# Patient Record
Sex: Female | Born: 1995 | Hispanic: No | Marital: Single | Smoking: Never smoker
Health system: Southern US, Community
[De-identification: ages and names within clinical notes are randomized; demographics above are authoritative.]

## PROBLEM LIST (undated history)

## (undated) DIAGNOSIS — R55 Syncope and collapse: Secondary | ICD-10-CM

---

## 2015-07-25 ENCOUNTER — Encounter (HOSPITAL_COMMUNITY): Payer: Self-pay | Admitting: Emergency Medicine

## 2015-07-25 ENCOUNTER — Emergency Department (HOSPITAL_COMMUNITY): Payer: Self-pay

## 2015-07-25 ENCOUNTER — Emergency Department (HOSPITAL_COMMUNITY)
Admission: EM | Admit: 2015-07-25 | Discharge: 2015-07-26 | Disposition: A | Discharge status: Home / Self Care | Payer: Self-pay | Attending: Emergency Medicine | Admitting: Emergency Medicine

## 2015-07-25 DIAGNOSIS — F419 Anxiety disorder, unspecified: Secondary | ICD-10-CM | POA: Insufficient documentation

## 2015-07-25 DIAGNOSIS — R0789 Other chest pain: Secondary | ICD-10-CM | POA: Insufficient documentation

## 2015-07-25 DIAGNOSIS — R51 Headache: Secondary | ICD-10-CM | POA: Insufficient documentation

## 2015-07-25 DIAGNOSIS — R519 Headache, unspecified: Secondary | ICD-10-CM

## 2015-07-25 DIAGNOSIS — R55 Syncope and collapse: Secondary | ICD-10-CM | POA: Insufficient documentation

## 2015-07-25 HISTORY — DX: Syncope and collapse: R55

## 2015-07-25 LAB — COMPREHENSIVE METABOLIC PANEL
ALBUMIN: 4.6 g/dL (ref 3.5–5.0)
ALT: 15 U/L (ref 14–54)
ANION GAP: 10 (ref 5–15)
AST: 25 U/L (ref 15–41)
Alkaline Phosphatase: 63 U/L (ref 38–126)
BUN: 12 mg/dL (ref 6–20)
CHLORIDE: 104 mmol/L (ref 101–111)
CO2: 23 mmol/L (ref 22–32)
Calcium: 9.9 mg/dL (ref 8.9–10.3)
Creatinine, Ser: 0.71 mg/dL (ref 0.44–1.00)
GFR calc Af Amer: >60 mL/min (ref >60)
Glucose, Bld: 91 mg/dL (ref 65–99)
POTASSIUM: 3.4 mmol/L — AB (ref 3.5–5.1)
Sodium: 137 mmol/L (ref 135–145)
Total Bilirubin: 0.7 mg/dL (ref 0.3–1.2)
Total Protein: 7.8 g/dL (ref 6.5–8.1)

## 2015-07-25 LAB — CBG MONITORING, ED: Glucose-Capillary: 93 mg/dL (ref 65–99)

## 2015-07-25 LAB — I-STAT TROPONIN, ED: TROPONIN I, POC: 0 ng/mL (ref 0.00–0.08)

## 2015-07-25 LAB — CBC
HEMATOCRIT: 36.7 % (ref 36.0–46.0)
HEMOGLOBIN: 12.2 g/dL (ref 12.0–15.0)
MCH: 25.5 pg — ABNORMAL LOW (ref 26.0–34.0)
MCHC: 33.2 g/dL (ref 30.0–36.0)
MCV: 76.6 fL — AB (ref 78.0–100.0)
Platelets: 252 10*3/uL (ref 150–400)
RBC: 4.79 MIL/uL (ref 3.87–5.11)
RDW: 13.1 % (ref 11.5–15.5)
WBC: 7.8 10*3/uL (ref 4.0–10.5)

## 2015-07-25 LAB — I-STAT BETA HCG BLOOD, ED (MC, WL, AP ONLY)

## 2015-07-25 LAB — I-STAT CG4 LACTIC ACID, ED: Lactic Acid, Venous: 1.25 mmol/L (ref 0.5–2.0)

## 2015-07-25 MED ORDER — SODIUM CHLORIDE 0.9 % IV BOLUS (SEPSIS)
1000.0000 mL | Freq: Once | INTRAVENOUS | Status: AC
  Administered 2015-07-25: 1000 mL via INTRAVENOUS

## 2015-07-25 MED ORDER — AMMONIA AROMATIC IN INHA
RESPIRATORY_TRACT | Status: AC
  Filled 2015-07-25: qty 10

## 2015-07-25 NOTE — ED Notes (Signed)
Bed: WA05 Expected date:  Expected time:  Means of arrival:  Comments: EMS 

## 2015-07-25 NOTE — ED Notes (Addendum)
Per EMS, pt. from home with complaint of chest pain with dizziness, pt. Was unresponsive upon EMS arrival, but once sat up by EMS pt. Started talking to EMS, no N/V . No SOB  . EMS reported of noticing pt. With twitching at times. Speak limited english.

## 2015-07-26 NOTE — ED Notes (Addendum)
Pt. Ambulated in hallway,steady gait but still claimed of light headedness and slight dizziness.no SOB.

## 2015-07-26 NOTE — ED Provider Notes (Signed)
CSN: [id number]     Arrival date & time 07/25/15  2059 History   First MD Initiated Contact with Patient 07/25/15 2104     Chief Complaint  Patient presents with  . Chest Pain  . Altered Mental Status     (Consider location/radiation/quality/duration/timing/severity/associated sxs/prior Treatment) HPI Patient presents to the emergency department with chest pain with dizziness and syncope in the patient was unresponsive when EMS arrived and the translator states that the patient has had no nausea, vomiting, shortness of breath, weakness,  blurred vision, back pain, neck pain, fever, cough, runny nose, sore throat, abdominal pain, or rash.  The patient recently came to the [place] from [place].  The patient has had episodes like this in the past.  Nothing seemed to make the patient's condition, better or worse.  She did complain of some mild headache earlier today Past Medical History  Diagnosis Date  . Syncope    History reviewed. No pertinent past surgical history. History reviewed. No pertinent family history. Social History  Substance Use Topics  . Smoking status: Never Smoker   . Smokeless tobacco: None  . Alcohol Use: No   OB History    No data available     Review of Systems  All other systems negative except as documented in the HPI. All pertinent positives and negatives as reviewed in the HPI.  Allergies  Review of patient's allergies indicates no known allergies.  Home Medications   Prior to Admission medications   Not on File   BP 111/58 mmHg  Pulse 63  Temp(Src) 98.3 F (36.8 C) (Oral)  Resp 16  SpO2 100%  LMP 07/09/2015 Physical Exam  Constitutional: She is oriented to person, place, and time. She appears well-developed and well-nourished. No distress.  HENT:  Head: Normocephalic and atraumatic.  Mouth/Throat: Oropharynx is clear and moist.  Eyes: Pupils are equal, round, and reactive to light.  Neck: Normal range of motion. Neck supple.   Cardiovascular: Normal rate, regular rhythm and normal heart sounds.  Exam reveals no gallop and no friction rub.   No murmur heard. Pulmonary/Chest: Effort normal and breath sounds normal. No respiratory distress.  Abdominal: Soft. Bowel sounds are normal. She exhibits no distension. There is no tenderness.  Musculoskeletal: She exhibits no edema.  Neurological: She is alert and oriented to person, place, and time. She exhibits normal muscle tone. Coordination normal.  Skin: Skin is warm and dry. No rash noted. No erythema.  Psychiatric: Her behavior is normal. Her mood appears anxious.  Nursing note and vitals reviewed.   ED Course  Procedures (including critical care time) Labs Review Labs Reviewed  COMPREHENSIVE METABOLIC PANEL - Abnormal; Notable for the following:    Potassium 3.4 (*)    All other components within normal limits  CBC - Abnormal; Notable for the following:    MCV 76.6 (*)    MCH 25.5 (*)    All other components within normal limits  CBG MONITORING, ED  I-STAT TROPOININ, ED  I-STAT BETA HCG BLOOD, ED ([facility], [facility], [facility] ONLY)  I-STAT CG4 LACTIC ACID, ED    Imaging Review Ct Head Wo Contrast  07/25/2015   CLINICAL DATA:  Chest pain, dizziness  EXAM: CT HEAD WITHOUT CONTRAST  TECHNIQUE: Contiguous axial images were obtained from the base of the skull through the vertex without intravenous contrast.  COMPARISON:  None.  FINDINGS: No skull fracture is noted. Paranasal sinuses and mastoids air cells are unremarkable. No intracranial hemorrhage, mass effect or midline  shift. No hydrocephalus. No mass lesion is noted on this unenhanced scan. The gray and white-matter differentiation is preserved. No intra or extra-axial fluid collection.  IMPRESSION: No acute intracranial abnormality.   Electronically Signed   By: Natasha Mead M.D.   On: 07/25/2015 23:21   I have personally reviewed and evaluated these images and lab results as part of my medical decision-making.   EKG  Interpretation   Date/Time:  Friday July 25 2015 21:27:04 EDT Ventricular Rate:  85 PR Interval:  149 QRS Duration: 95 QT Interval:  365 QTC Calculation: 434 R Axis:   86 Text Interpretation:  Sinus rhythm Normal ECG Confirmed by POLLINA  MD,  CHRISTOPHER [phone]) on 07/25/2015 9:30:45 PM      Patient most likely has vasovagal syncope based on the history and physical.  The patient seems very anxious on examination and is tearful.  The patient does not have any abnormalities noted in her vital signs.  Her EKG did not show any acute findings.  The patient will be referred to cardiology for further evaluation of the syncopal events.  There is no definite cause at this time.  Patient is ambulated without difficulty   Charlestine Night, PA-C 07/26/15 0059  Charlestine Night, PA-C 07/26/15 8119  Gilda Crease, MD 07/27/15 [phone]

## 2015-07-26 NOTE — Discharge Instructions (Signed)
Return here as needed.  Increase her fluid intake, rest as much as possible.  You will need to follow-up with a primary care doctor or an urgent care

## 2017-05-19 ENCOUNTER — Ambulatory Visit (INDEPENDENT_AMBULATORY_CARE_PROVIDER_SITE_OTHER): Payer: BLUE CROSS/BLUE SHIELD | Admitting: Physician Assistant

## 2017-05-19 ENCOUNTER — Encounter (INDEPENDENT_AMBULATORY_CARE_PROVIDER_SITE_OTHER): Payer: Self-pay | Admitting: Physician Assistant

## 2017-05-19 VITALS — BP 120/71 | HR 83 | Temp 98.4°F | Ht 61.0 in | Wt 125.0 lb

## 2017-05-19 DIAGNOSIS — B86 Scabies: Secondary | ICD-10-CM

## 2017-05-19 MED ORDER — PERMETHRIN 5 % EX CREA: 1.0000 "application " | TOPICAL_CREAM | Freq: Once | CUTANEOUS | 0 refills | Status: AC

## 2017-05-19 MED ORDER — HYDROXYZINE HCL 25 MG PO TABS: 25.0000 mg | ORAL_TABLET | Freq: Every day | ORAL | 0 refills | Status: DC

## 2017-05-19 MED ORDER — PREDNISONE 20 MG PO TABS: 40.0000 mg | ORAL_TABLET | Freq: Every day | ORAL | 0 refills | Status: AC

## 2017-05-19 NOTE — Patient Instructions (Addendum)
Scabies, Adult Scabies is a skin condition that happens when very small insects get under the skin (infestation). This causes a rash and severe itchiness. Scabies can spread from person to person (is contagious). If you get scabies, it is common for others in your household to get scabies too. With proper treatment, symptoms usually go away in 2-4 weeks. Scabies usually does not cause lasting problems. What are the causes? This condition is caused by mites (Sarcoptes scabiei, or human itch mites) that can only be seen with a microscope. The mites get into the top layer of skin and lay eggs. Scabies can spread from person to person through:  Close contact with a person who has scabies.  Contact with infested items, such as towels, bedding, or clothing.  What increases the risk? This condition is more likely to develop in:  People who live in nursing homes and other extended-care facilities.  People who have sexual contact with a partner who has scabies.  Young children who attend child care facilities.  People who care for others who are at increased risk for scabies.  What are the signs or symptoms? Symptoms of this condition may include:  Severe itchiness. This is often worse at night.  A rash that includes tiny red bumps or blisters. The rash commonly occurs on the wrist, elbow, armpit, fingers, waist, groin, or buttocks. Bumps may form a line (burrow) in some areas.  Skin irritation. This can include scaly patches or sores.  How is this diagnosed? This condition is diagnosed with a physical exam. Your health care provider will look closely at your skin. In some cases, your health care provider may take a sample of your affected skin (skin scraping) and have it examined under a microscope. How is this treated? This condition may be treated with:  Medicated cream or lotion that kills the mites. This is spread on the entire body and left on for several hours. Usually, one treatment  with medicated cream or lotion is enough to kill all of the mites. In severe cases, the treatment may be repeated.  Medicated cream that relieves itching.  Medicines that help to relieve itching.  Medicines that kill the mites. This treatment is rarely used.  Follow these instructions at home:  Medicines  Take or apply over-the-counter and prescription medicines as told by your health care provider.  Apply medicated cream or lotion as told by your health care provider.  Do not wash off the medicated cream or lotion until the necessary amount of time has passed. Skin Care  Avoid scratching your affected skin.  Keep your fingernails closely trimmed to reduce injury from scratching.  Take cool baths or apply cool washcloths to help reduce itching. General instructions  Clean all items that you recently had contact with, including bedding, clothing, and furniture. Do this on the same day that your treatment starts. ? Use hot water when you wash items. ? Place unwashable items into closed, airtight plastic bags for at least 3 days. The mites cannot live for more than 3 days away from human skin. ? Vacuum furniture and mattresses that you use.  Make sure that other people who may have been infested are examined by a health care provider. These include members of your household and anyone who may have had contact with infested items.  Keep all follow-up visits as told by your health care provider. This is important. Contact a health care provider if:  You have itching that does not go away   after 4 weeks of treatment.  You continue to develop new bumps or burrows.  You have redness, swelling, or pain in your rash area after treatment.  You have fluid, blood, or pus coming from your rash. This information is not intended to replace advice given to you by your health care provider. Make sure you discuss any questions you have with your health care provider. Document Released:  07/16/2015 Document Revised: 04/01/2016 Document Reviewed: 05/27/2015 Elsevier Interactive Patient Education  2018 Elsevier Inc.    Permethrin skin cream ?y l thu?c g? Kem bi da PERMETHRIN ???c dng ?? ?i?u tr? b?nh gh?. Thu?c ny c th? ???c dng cho nh?ng m?c ?ch khc; hy h?i ng??i cung c?p d?ch v? y t? ho?c d??c s? c?a mnh, n?u qu v? c th?c m?c. (CC) NHN HI?U PH? BI?N: Acticin, Elimite Ti c?n ph?i bo cho ng??i cung c?p d?ch v? y t? c?a mnh ?i?u g tr??c khi dng thu?c ny? H? c?n bi?t li?u qu v? hi?n c b?t k? tnh tr?ng no sau ?y hay khng: -hen suy?n -pha?n ??ng b?t th???ng ho??c d? ?ng v?i permethrin, thu?c tr? su dng trong th y ho?c gia d?ng, cc d??c ph?m khc, ho?c cy hoa cc -pha?n ??ng b?t th???ng ho??c di? ??ng v??i th??c ph?m, thu?c nhu?m, ho??c ch?t ba?o qua?n -?ang c thai ho??c ??nh co? thai -?ang cho con bu? Ti nn s? d?ng thu?c ny nh? th? no? Thu?c ny ch? ?? dng ngoi da. Khng ???c u?ng. Hy lm theo cc h??ng d?n trn h?p thu?c ho?c nhn thu?c. K[facility]NG khuy?n co t?m trong b?n ho?c t?m b?ng vi hoa sen tr??c khi bi thu?c ny. Ch k? kem vo t?t c? cc b? m?t da, t? ??u ??n gt chn. ?i?u quan tr?ng l ph?i bi ln t?t c? m?i ch? trn c? th?, khng ch? ? ch? n?i m?n. Bi kem vo cc n?p nh?n ? ngn tay v ngn chn, vo n?p g?p ? c? tay v th?t l?ng, vo khe mng, vo c? quan sinh d?c, v vo r?n. Dng ci t?m ?? bi kem vo pha d??i mng tay v mng chn. Nn c?t ng?n mng tay mng chn. N?u qu v? c t ho?c khng c tc, ho?c n?u qu v? bi kem cho em b s? sinh ho?c tr? nh?, th hy ch?c ch?n r?ng qu v? ch kem vo c?, da ??u, ???ng chn tc, thi d??ng, v trn. ?? yn trong 8 ??n 14 gi? ??ng h?, sau ? t?m v g?i ??u ?? lo?i b? thu?c. N?u qu v? bi thu?c ny cho ng??i khc, hy ?eo g?ng tay plastic ho?c g?ng tay lo?i dng m?t l?n ?? b?o v? mnh kh?i b? nhi?m m?m b?nh. Trnh ?? thu?c ny dnh vo m?t c?a mnh. N?u qu v? b?, th hy r?a v?i  nhi?u n??c my (tap water) mt. Hy bn v?i bc s? nhi khoa c?a qu v? v? vi?c dng thu?c ny ? tr? em. Thu?c ny c th? ???c k toa cho tr? nh? ch? m?i 2 thng tu?i trong nh?ng tr??ng h?p ch?n l?c, nh?ng c?n ph?i th?n tr?ng. Qu li?u: N?u qu v? cho r?ng mnh ? dng qu nhi?u thu?c ny, th hy lin l?c v?i trung tm ki?m sot ch?t ??c ho?c phng c?p c?u ngay l?p t?c. L?U : Thu?c ny ch? dnh ring cho qu v?. Khng chia s? thu?c ny v?i nh?ng ng??i khc. N?u ti l? qun m?t li?u th sao? ?i?u ny khng p d?ng. Nh?ng g  c th? t??ng tc v?i thu?c ny? Cc t??ng tc v?i thu?c khng x?y ra. Khng ???c dng b?t k? ch? ph?m dng cho da no khc ? vng b? ?nh h??ng m khng h?i  ki?n bc s? ho?c [place] vin y t? c?a mnh. Danh sch ny c th? khng m t? ?? h?t cc t??ng tc c th? x?y ra. Hy ??a cho ng??i cung c?p d?ch v? y t? c?a mnh danh sch t?t c? cc thu?c, th?o d??c, cc thu?c khng c?n toa, ho?c cc ch? ph?m b? sung m qu v? dng. C?ng nn bo cho h? bi?t r?ng qu v? c ht thu?c, u?ng r??u, ho?c c s? d?ng ma ty tri php hay khng. Vi th? c th? t??ng tc v?i thu?c c?a qu v?. Ti c?n ph?i theo di ?i?u g trong khi dng thu?c ny? Khng c g l b?t th??ng n?u tnh tr?ng ng?a v n?i m?n cn ti?p t?c cho t?i 2 ??n 4 tu?n sau khi ?i?u tr?Marland Kitchen Nh?ng tri?u ch?ng ny c th? l ph?n ?ng t?m th?i v?i xc c?a nh?ng con m?t (mites). ?i?u ny khng c ngh?a l kem khng c tc d?ng ho?c c?n ph?i bi kem l?i. N?u qu v? c?m th?y tnh tr?ng ng?a v n?i m?n tr? nn d? d?i, ho?c v?n ti?p t?c sau 4 tu?n, th hy bo ngay cho bc s? ho?c chuyn vin y t?. B?nh gh? ly lan do ti?p xc tr?c ti?p v?i da c?a ng??i b? nhi?m b?nh. Cc thnh vin trong gia ?nh v b?n tnh/ng??i ph?i ng?u c?ng c th? c?n ???c ?i?u tr? b?ng thu?c ny. Qu v? nn th?o lu?n ?i?u ny v?i bc s? ho?c chuyn vin y t?. Qu v? nn gi?t t?t c? qu?n o, kh?n v t?m tr?i gi??ng ? ch?m vo da qu v?, b?ng cch s? d?ng chu trnh gi?t bnh  th??ng. Qu v? khng c?n ph?i gi?t l?i ?? s?ch ch?a m?c. Khng c?n ph?i dng ph??ng cch ??c bi?t no ? ?? t?y r?a o khoc, ?? ??c trong nh, th?m tr?i sn, sn nh v t??ng. Ti c th? nh?n th?y nh?ng tc d?ng ph? no khi dng thu?c ny? Cc tc d?ng ph? khng c?n ph?i ch?m [employer] y t? (hy bo cho bc s? ho?c chuyn vin y t?, n?u cc tc d?ng ph? ny ti?p di?n ho?c gy phi?n toi): -ng?a ngy -t -n?i ban -da b? m?n ?? ho?c s?ng nh? -nh?c ho?c nng rt -c?m gic nh? b? ki?n b Danh sch ny c th? khng m t? ?? h?t cc tc d?ng ph? c th? x?y ra. Xin g?i t?i bc s? c?a mnh ?? ???c c? v?n chuyn mn v? cc tc d?ng ph?Ladell Heads v? c th? t??ng trnh cc tc d?ng ph? cho FDA theo s? [phone]. Ti nn c?t gi? thu?c c?a mnh ? ?u? ?? ngoi t?m tay tr? em. C?t gi? ? nhi?t ?? phng, trnh nhi?t v nh sng tr?c ti?p. Khng ???c ??p l?nh ho?c ?? ?ng l?nh. V?t b? t?t c? thu?c ch?a dng sau ngy h?t h?n in trn nhn thu?c ho?c bao thu?c. L?U : ?y l b?n tm t?t. N c th? khng bao hm t?t c? thng tin c th? c. N?u qu v? th?c m?c v? thu?c ny, xin trao ??i v?i bc s?, d??c s?, ho?c ng??i cung c?p d?ch v? y t? c?a mnh.  2018 Elsevier/Gold Standard (2016-02-09 00:00:00)

## 2017-05-19 NOTE — Progress Notes (Signed)
Subjective:  Patient ID: Chelsey Sampson, female    DOB: 1996/07/27  Age: 21 y.o. MRN: [id number]  CC: rash  HPI Chelsey Sampson is a 21 y.o. female with no significant PMH presents with a rash on body and face. Rash is pruritic and manifested as non convergent lesions on lower abdomen, upper chest, upper arms, face, back, and buttocks. Onset of rash approximately 5 years ago. Has been to her PCP in [place] but prescribed oral medication that did not work. She believes that she was told she had scabies. Does not endorse cellulitis, suppuration, scaling, crusting, bleeding, f/c/n/v, chest pain, SOB, HA, joint pains, or GI/GU sxs.  ROS Review of Systems  Constitutional: Negative for chills, fever and malaise/fatigue.  Eyes: Negative for blurred vision.  Respiratory: Negative for shortness of breath.   Cardiovascular: Negative for chest pain and palpitations.  Gastrointestinal: Negative for abdominal pain and nausea.  Genitourinary: Negative for dysuria and hematuria.  Musculoskeletal: Negative for joint pain and myalgias.  Skin: Positive for rash.  Neurological: Negative for tingling and headaches.  Psychiatric/Behavioral: Negative for depression. The patient is not nervous/anxious.     Objective:  BP 120/71 (BP Location: Left Arm, Patient Position: Sitting, Cuff Size: Normal)   Pulse 83   Temp 98.4 F (36.9 C) (Oral)   Ht 5\' 1"  (1.549 m)   Wt 125 lb (56.7 kg)   LMP 05/10/2017 (Approximate)   SpO2 99%   BMI 23.62 kg/m   BP/Weight 05/19/2017 07/26/2015  Systolic BP 120 108  Diastolic BP 71 68  Wt. (Lbs) 125 -  BMI 23.62 -      Physical Exam  Constitutional: She is oriented to person, place, and time.  Well developed, well nourished, NAD, polite  HENT:  Head: Normocephalic and atraumatic.  Eyes: No scleral icterus.  Neck: Normal range of motion. Neck supple. No thyromegaly present.  Cardiovascular: Normal rate, regular rhythm and normal heart sounds.   Pulmonary/Chest: Effort  normal and breath sounds normal.  Musculoskeletal: She exhibits no edema.  Neurological: She is alert and oriented to person, place, and time.  Skin: Skin is warm and dry.  Multiple, small, rounded, nonconvergent, hyperpigmented postinflammatory lesions located on lower abdomen, upper chest, upper arms, face, back, and buttocks.  Psychiatric: She has a normal mood and affect. Her behavior is normal. Thought content normal.  Vitals reviewed.    Assessment & Plan:   1. Scabies - predniSONE (DELTASONE) 20 MG tablet; Take 2 tablets (40 mg total) by mouth daily with breakfast.  Dispense: 10 tablet; Refill: 0 - hydrOXYzine (ATARAX/VISTARIL) 25 MG tablet; Take 1 tablet (25 mg total) by mouth at bedtime.  Dispense: 10 tablet; Refill: 0 - permethrin (ELIMITE) 5 % cream; Apply 1 application topically once.  Dispense: 60 g; Refill: 0   Meds ordered this encounter  Medications  . predniSONE (DELTASONE) 20 MG tablet    Sig: Take 2 tablets (40 mg total) by mouth daily with breakfast.    Dispense:  10 tablet    Refill:  0    Order Specific Question:   Supervising Provider    Answer:   Quentin Angst [id number]  . hydrOXYzine (ATARAX/VISTARIL) 25 MG tablet    Sig: Take 1 tablet (25 mg total) by mouth at bedtime.    Dispense:  10 tablet    Refill:  0    Order Specific Question:   Supervising Provider    Answer:   Quentin Angst [id number]  . permethrin (ELIMITE) 5 % cream  Sig: Apply 1 application topically once.    Dispense:  60 g    Refill:  0    Order Specific Question:   Supervising Provider    Answer:   Quentin AngstJEGEDE, OLUGBEMIGA E [[id number]]    Follow-up: Return in about 4 weeks (around 06/16/2017) for f/u rash.   Loletta Specteroger David Gomez PA

## 2017-06-16 ENCOUNTER — Ambulatory Visit (INDEPENDENT_AMBULATORY_CARE_PROVIDER_SITE_OTHER): Payer: BLUE CROSS/BLUE SHIELD | Admitting: Physician Assistant

## 2017-06-16 ENCOUNTER — Encounter (INDEPENDENT_AMBULATORY_CARE_PROVIDER_SITE_OTHER): Payer: Self-pay | Admitting: Physician Assistant

## 2017-06-16 VITALS — BP 117/70 | HR 86 | Temp 98.6°F | Wt 124.4 lb

## 2017-06-16 DIAGNOSIS — L989 Disorder of the skin and subcutaneous tissue, unspecified: Secondary | ICD-10-CM

## 2017-06-16 MED ORDER — CHLORHEXIDINE GLUCONATE 4 % EX LIQD: Freq: Every day | CUTANEOUS | 0 refills | Status: AC | PRN

## 2017-06-16 MED ORDER — CLINDAMYCIN HCL 300 MG PO CAPS: 300.0000 mg | ORAL_CAPSULE | Freq: Three times a day (TID) | ORAL | 0 refills | Status: AC

## 2017-06-16 MED ORDER — HYDROXYZINE HCL 25 MG PO TABS: 25.0000 mg | ORAL_TABLET | Freq: Every day | ORAL | 0 refills | Status: AC

## 2017-06-16 MED ORDER — CHLORHEXIDINE GLUCONATE 4 % EX LIQD: Freq: Every day | CUTANEOUS | Status: DC | PRN

## 2017-06-16 NOTE — Progress Notes (Signed)
Subjective:  Patient ID: Chelsey Sampson, female    DOB: 23-Feb-1996  Age: 21 y.o. MRN: [id number]  CC: rash f/u  HPI Chelsey Sampson is a 21 y.o. female with a PMH of syncope and rash presents to f/u on rash. She has papules on face and body. Rash is pruritic and manifested as non convergent lesions on lower abdomen, upper chest, upper arms, face, back, and buttocks. Onset of rash approximately 5 years ago. Has been to her PCP in [place] but prescribed oral medication that did not work. She believes that she was told she had scabies. Does not endorse cellulitis, suppuration, scaling, crusting, bleeding, f/c/n/v, chest pain, SOB, HA, joint pains, or GI/GU sxs.     Outpatient Medications Prior to Visit  Medication Sig Dispense Refill  . predniSONE (DELTASONE) 20 MG tablet Take 2 tablets (40 mg total) by mouth daily with breakfast. 10 tablet 0  . hydrOXYzine (ATARAX/VISTARIL) 25 MG tablet Take 1 tablet (25 mg total) by mouth at bedtime. 10 tablet 0   No facility-administered medications prior to visit.      ROS Review of Systems  Constitutional: Negative for chills, fever and malaise/fatigue.  Eyes: Negative for blurred vision.  Respiratory: Negative for shortness of breath.   Cardiovascular: Negative for chest pain and palpitations.  Gastrointestinal: Negative for abdominal pain and nausea.  Genitourinary: Negative for dysuria and hematuria.  Musculoskeletal: Negative for joint pain and myalgias.  Skin: Positive for rash.  Neurological: Negative for tingling and headaches.  Psychiatric/Behavioral: Negative for depression. The patient is not nervous/anxious.     Objective:  BP 117/70 (BP Location: Left Arm, Patient Position: Sitting, Cuff Size: Normal)   Pulse 86   Temp 98.6 F (37 C) (Oral)   Wt 124 lb 6.4 oz (56.4 kg)   LMP 06/08/2017 (Approximate)   SpO2 99%   BMI 23.51 kg/m   BP/Weight 06/16/2017 05/19/2017 07/26/2015  Systolic BP 117 120 108  Diastolic BP 70 71 68  Wt. (Lbs)  124.4 125 -  BMI 23.51 23.62 -      Physical Exam  Constitutional: She is oriented to person, place, and time.  Well developed, well nourished, NAD, polite  HENT:  Head: Normocephalic and atraumatic.  Eyes: No scleral icterus.  Cardiovascular: Normal rate, regular rhythm and normal heart sounds.   Pulmonary/Chest: Effort normal and breath sounds normal.  Neurological: She is alert and oriented to person, place, and time.  Skin: Skin is warm and dry.  Nonconvergent, small, erythematous, papules, most lesions are postinflammatory on face, lower abdomen, upper chest, upper arms, back, and buttocks.  Psychiatric: She has a normal mood and affect. Her behavior is normal. Thought content normal.  Vitals reviewed.    Assessment & Plan:    1. Skin lesions - Originally thought to be scabies due to distribution, intense pruritis, chronicity, and reported relief with steroid. However, patient has failed on prescribed permethrin and prednisone.  - Ambulatory referral to Dermatology - chlorhexidine (HIBICLENS) 4 % external liquid; Apply topically daily as needed.  Dispense: 120 mL; Refill: 0 - clindamycin (CLEOCIN) 300 MG capsule; Take 1 capsule (300 mg total) by mouth 3 (three) times daily.  Dispense: 28 capsule; Refill: 0 - hydrOXYzine (ATARAX/VISTARIL) 25 MG tablet; Take 1 tablet (25 mg total) by mouth at bedtime.  Dispense: 10 tablet; Refill: 0   Meds ordered this encounter  Medications  . DISCONTD: chlorhexidine (HIBICLENS) 4 % liquid  . chlorhexidine (HIBICLENS) 4 % external liquid    Sig: Apply  topically daily as needed.    Dispense:  120 mL    Refill:  0    Order Specific Question:   Supervising Provider    Answer:   Quentin AngstJEGEDE, OLUGBEMIGA E [id number]  . clindamycin (CLEOCIN) 300 MG capsule    Sig: Take 1 capsule (300 mg total) by mouth 3 (three) times daily.    Dispense:  28 capsule    Refill:  0    Order Specific Question:   Supervising Provider    Answer:   Quentin AngstJEGEDE, OLUGBEMIGA  E [id number]  . hydrOXYzine (ATARAX/VISTARIL) 25 MG tablet    Sig: Take 1 tablet (25 mg total) by mouth at bedtime.    Dispense:  10 tablet    Refill:  0    Order Specific Question:   Supervising Provider    Answer:   Quentin AngstJEGEDE, OLUGBEMIGA E [id number]]    Follow-up: Return in about 6 weeks (around 07/28/2017) for skin lesions.   Loletta Specteroger David Sabastian Raimondi PA

## 2017-06-16 NOTE — Progress Notes (Deleted)
1. Skin lesions - Originally thought to be scabies due to distribution, intense pruritis, chronicity, and reported relief with steroid. However, patient has failed on prescribed permethrin and prednisone.  - Ambulatory referral to Dermatology - chlorhexidine (HIBICLENS) 4 % external liquid; Apply topically daily as needed.  Dispense: 120 mL; Refill: 0 - clindamycin (CLEOCIN) 300 MG capsule; Take 1 capsule (300 mg total) by mouth 3 (three) times daily.  Dispense: 28 capsule; Refill: 0 - hydrOXYzine (ATARAX/VISTARIL) 25 MG tablet; Take 1 tablet (25 mg total) by mouth at bedtime.  Dispense: 10 tablet; Refill: 0     Meds ordered this encounter  Medications  . DISCONTD: chlorhexidine (HIBICLENS) 4 % liquid  . chlorhexidine (HIBICLENS) 4 % external liquid    Sig: Apply topically daily as needed.    Dispense:  120 mL    Refill:  0    Order Specific Question:   Supervising Provider    Answer:   Quentin AngstJEGEDE, OLUGBEMIGA E [id number]  . clindamycin (CLEOCIN) 300 MG capsule    Sig: Take 1 capsule (300 mg total) by mouth 3 (three) times daily.    Dispense:  28 capsule    Refill:  0    Order Specific Question:   Supervising Provider    Answer:   Quentin AngstJEGEDE, OLUGBEMIGA E [id number]  . hydrOXYzine (ATARAX/VISTARIL) 25 MG tablet    Sig: Take 1 tablet (25 mg total) by mouth at bedtime.    Dispense:  10 tablet    Refill:  0    Order Specific Question:   Supervising Provider    Answer:   Quentin AngstJEGEDE, OLUGBEMIGA E [id number]    Follow-up: Return in about 6 weeks (around 07/28/2017) for skin lesions.   Loletta Specteroger David Adaleena Mooers PA

## 2017-07-06 ENCOUNTER — Encounter (INDEPENDENT_AMBULATORY_CARE_PROVIDER_SITE_OTHER): Payer: Self-pay | Admitting: Physician Assistant

## 2017-07-28 ENCOUNTER — Encounter (INDEPENDENT_AMBULATORY_CARE_PROVIDER_SITE_OTHER): Payer: Self-pay | Admitting: Physician Assistant

## 2017-07-28 ENCOUNTER — Ambulatory Visit (INDEPENDENT_AMBULATORY_CARE_PROVIDER_SITE_OTHER): Payer: BLUE CROSS/BLUE SHIELD | Admitting: Physician Assistant

## 2017-07-28 VITALS — BP 116/72 | HR 73 | Temp 98.1°F | Ht 59.45 in | Wt 122.0 lb

## 2017-07-28 DIAGNOSIS — R12 Heartburn: Secondary | ICD-10-CM

## 2017-07-28 DIAGNOSIS — G8929 Other chronic pain: Secondary | ICD-10-CM | POA: Diagnosis not present

## 2017-07-28 DIAGNOSIS — L709 Acne, unspecified: Secondary | ICD-10-CM | POA: Diagnosis not present

## 2017-07-28 DIAGNOSIS — Z23 Encounter for immunization: Secondary | ICD-10-CM | POA: Diagnosis not present

## 2017-07-28 DIAGNOSIS — Z0000 Encounter for general adult medical examination without abnormal findings: Secondary | ICD-10-CM | POA: Diagnosis not present

## 2017-07-28 DIAGNOSIS — M25561 Pain in right knee: Secondary | ICD-10-CM

## 2017-07-28 DIAGNOSIS — M25562 Pain in left knee: Secondary | ICD-10-CM | POA: Diagnosis not present

## 2017-07-28 MED ORDER — NAPROXEN 500 MG PO TABS: 500.0000 mg | ORAL_TABLET | Freq: Two times a day (BID) | ORAL | 0 refills | Status: AC

## 2017-07-28 MED ORDER — SALICYLIC ACID 2 % EX PADS: 1.0000 "application " | MEDICATED_PAD | Freq: Every day | CUTANEOUS | 5 refills | Status: AC

## 2017-07-28 MED ORDER — OMEPRAZOLE 40 MG PO CPDR: 40.0000 mg | DELAYED_RELEASE_CAPSULE | Freq: Every day | ORAL | 3 refills | Status: AC

## 2017-07-28 MED ORDER — TRETINOIN 0.1 % EX CREA: TOPICAL_CREAM | Freq: Every day | CUTANEOUS | 0 refills | Status: AC

## 2017-07-28 NOTE — Patient Instructions (Signed)
M?n tr?ng c [place] M?n tr?ng c l m?t v?n ?? ? da gy ra m?n. M?n tr?ng c x?y ra khi l? chn lng ? da b? t?c. L? chn lng c th? b? nhi?m khu?n, ho?c c th? b? ??, lot v s?ng. M?n tr?ng c l m?t v?n ?? ? da ph? bi?n, ??c bi?t l v?i thanh thi?u nin. M?n tr?ng c th??ng t? h?t theo th?i gian. Nguyn nhn g gy ra? M?i l? chn lng ch?a m?t tuy?n nh?n. Cc tuy?n nh?n t?o ra m?t ch?t nh?n g?i l b nh?n. M?n tr?ng c x?y ra khi nh?ng tuy?n ny b? b nh?n, cc t? bo da ch?t v b?i b?n lm t?c. Sau ?, vi khu?n th??ng c ? cc tuy?n nh?n s? sinh si v gy vim. M?n tr?ng c th??ng kh?i pht do nh?ng thay ??i v? hoc mn c?a qu v?. Nh?ng thay ??i hoc mn ny c th? lm cc tuy?n nh?n l?n h?n v t?o ra nhi?u b nh?n h?n. Nh?ng y?u t? lm cho m?n tr?ng c tr?m tr?ng h?n bao g?m:  Hoc mn thay ??i trong th?i gian: ? Thanh nin. ? Cc chu k? kinh nguy?t c?a ph? n?. ? Mang [place].  M? ph?m v cc s?n ph?m ch?m [place] tc g?c d?u.  Ch m?nh ln da.  X phng m?nh.  C?ng th?ng.  Nh?ng v?n ?? hoc mn do m?t s? b?nh nh?t ??nh.  Tc di ho?c nhi?u d?u c? xt vo da.  M?t s? lo?i thu?c nh?t ??nh.  p l?c do b?ng bu?c ??u, ba l, ho?c mi?ng ??m vai.  Ti?p xc v?i m?t s? lo?i d?u v ha ch?t.  ?i?u g lm t?ng nguy c?? Tnh tr?ng ny hay x?y ra h?n ?:  Thanh thi?u nin.  Nh?ng ng??i c ti?n s? gia ?nh b? m?n tr?ng c.  Cc d?u hi?u ho?c tri?u ch?ng l g? M?n tr?ng c th??ng xu?t hi?n ? m?t, c?, ng?c v vng l?ng trn. Cc tri?u ch?ng bao g?m:  M?n nh?, ?? (m?n nh?t ho?c n?t s?n).  M?n ??u tr?ng.  M?n ??u ?en.  Cc m?n nh? c m? (m?n m?).  M?n to, ?? ho?c m?n m? nh?y c?m ?au.  M?n tr?ng c n?ng h?n c th? gy ra:  Vng nhi?m trng b? m?ng m? (p-xe).  Cc ti c?ng, ?au v ch?a ??y d?ch (nang).  S?o.  Ch?n ?on tnh tr?ng ny nh? th? no? Tnh tr?ng ny ???c ch?n ?on d?a vo khai thc b?nh s? v khm th?c th?. Xt nghi?m mu c?ng c th? ???c th?c hi?n. Tnh tr?ng  ny ???c ?i?u tr? nh? th? no? ?i?u tr? tnh tr?ng ny c th? khc nhau ty theo m?c ?? n?ng c?a m?n tr?ng c. ?i?u tr? c th? bao g?m:  Kem v kem l?ng (lotion) ng?n khng cho cc tuy?n nh?n b? t?c.  Kem v kem l?ng ?i?u tr? ho?c trnh nhi?m trng v vim.  Thu?c khng sinh d?ng bi da ho?c d?ng vin.  Thu?c vin gip gi?m hnh thnh b d?u.  Thu?c vin trnh [place].  ?i?u tr? b?ng nh sng ho?c laser.  Ph?u thu?t.  Tim thu?c vo khu v?c b? ?nh h??ng.  Cc ha ch?t gy trc da.  Chuyn gia ch?m [employer] s?c kh?e c?ng s? khuy?n ngh? cch ch?m [place] da t?t nh?t cho qu v?. Ch?m [place] da t?t l ph?n quan tr?ng nh?t trong ?i?u tr?Noralee Stain th? nh?ng h??ng d?n ny ? nh: Ch?m so?c da Ch?m [place]  da qu v? theo ch? d?n c?a chuyn gia ch?m [place] s?c kh?e. Qu v? c th? ???c khuyn lm nh?ng vi?c ny:  R?a da nh? nhng t nh?t hai l?n m?i ngy, c?ng nh? l: ? Sau khi t?p luy?n. ? Tr??c khi ?i ng?.  S? d?ng x phng lo?i nh?.  Bi thu?c lm ?m da g?c n??c sau khi r?a s?ch da.  S? d?ng m? ph?m ch?ng n?ng ho?c thu?c ch?ng n?ng c SPF t? 30 tr? ln. Vi?c ny l ??c bi?t quan tr?ng n?u qu v? ?ang dng thu?c ch?ng m?n tr?ng c.  L?a ch?n cc m? ph?m khng lm t?c cc tuy?n nh?n (khng gy n?i m?n).  Thu?c  Ch? s? d?ng thu?c khng k ??n v thu?c k ??n theo ch? d?n c?a chuyn gia ch?m [place] s?c kh?e.  N?u qu v? ???c k thu?c khng sinh, hy bi ho?c dng thu?c theo ch? d?n c?a chuyn gia ch?m [place] s?c kh?e. Khng d?ng s? d?ng thu?c khng sinh ngay c? khi tnh tr?ng c?a qu v? c?i thi?n. H??ng d?n chung  Gi? cho tc s?ch v khng bm vo m?t. N?u tc qu v? d?ng d?u, hy g?i ??u th??ng xuyn ho?c hng ngy.  [place] t c?m ho?c trn vo tay qu v?.  Trnh ?eo b?ng bu?c ??u ho?c ??i m? ch?t.  Trnh c?y ho?c n?n m?n. Vi?c ny c th? lm m?n tr?ng c tr?m tr?ng h?n v t?o thnh s?o.  Tun th? t?t c? cc cu?c h?n khm l?i theo ch? d?n c?a chuyn gia ch?m [place] s?c kh?e. ?i?u ny c vai tr quan  tr?ng.  C?o ru nh? nhng v ch? khi c?n thi?t.  Theo di t?p ch th?c ph?m ?? pht hi?n b?t k? lo?i th?c ?n no c lin quan ??n m?n tr?ng c. Hy lin l?c v?i chuyn gia ch?m [place] s?c kh?e n?u:  M?n tr?ng c c?a qu v? khng ?? h?n sau tm tu?n.  M?n tr?ng c c?a qu v? tr? nn tr?m tr?ng h?n.  Qu v? c m?t vng da l?n b? ?? ho?c nh?y c?m ?au.  Qu v? ngh? r?ng qu v? ?ang c tc d?ng ph? c?a b?t k? lo?i thu?c ?i?u tr? m?n tr?ng c no. Thng tin ny khng nh?m m?c ?ch thay th? cho l?i khuyn m chuyn gia ch?m [place] s?c kh?e ni v?i qu v?. Hy b?o ??m qu v? ph?i th?o lu?n b?t k? v?n ?? g m qu v? c v?i chuyn gia ch?m [place] s?c kh?e c?a qu v?. Document Released: 10/25/2005 Document Revised: 10/14/2016 Document Reviewed: 01/01/2015 Elsevier Interactive Patient Education  2018 [employer].

## 2017-07-28 NOTE — Progress Notes (Signed)
Subjective:  Patient ID: Chelsey Sampson, female    DOB: [birth year]  Age: 21 y.o. MRN: [id number]  CC: annual exam  HPI Chelsey Sampson is a 21 y.o. female with a medical history of acne and syncope presents for an annual physical. She is generally well except for concerns of her acne. Also has occasional bilateral knee pain and heartburn. Knee pain attributed to a fall years ago. No surgery or physical therapy needed. Aggravated with prolonged standing/walking. Does not usually take anything for pain. Heartburn usually occurs when eating but especially when eating spicy foods. Has not taken anything for relief. Does not endorse any other symptoms or complaints.      Outpatient Medications Prior to Visit  Medication Sig Dispense Refill  . chlorhexidine (HIBICLENS) 4 % external liquid Apply topically daily as needed. 120 mL 0  . hydrOXYzine (ATARAX/VISTARIL) 25 MG tablet Take 1 tablet (25 mg total) by mouth at bedtime. 10 tablet 0  . predniSONE (DELTASONE) 20 MG tablet Take 2 tablets (40 mg total) by mouth daily with breakfast. (Patient not taking: Reported on 07/28/2017) 10 tablet 0   No facility-administered medications prior to visit.      ROS Review of Systems  Constitutional: Negative for chills, fever and malaise/fatigue.  Eyes: Negative for blurred vision.  Respiratory: Negative for shortness of breath.   Cardiovascular: Negative for chest pain and palpitations.  Gastrointestinal: Positive for heartburn. Negative for abdominal pain and nausea.  Genitourinary: Negative for dysuria and hematuria.  Musculoskeletal: Positive for joint pain. Negative for myalgias.  Skin: Negative for rash.       acne  Neurological: Negative for tingling and headaches.  Psychiatric/Behavioral: Negative for depression. The patient is not nervous/anxious.     Objective:  BP 116/72 (BP Location: Left Arm, Patient Position: Sitting, Cuff Size: Normal)   Pulse 73   Temp 98.1 F (36.7 C) (Oral)   Ht 4'  11.45" (1.51 m)   Wt 122 lb (55.3 kg)   LMP 07/02/2017 (Exact Date)   SpO2 99%   BMI 24.27 kg/m   BP/Weight 07/28/2017 06/16/2017 05/19/2017  Systolic BP 116 117 120  Diastolic BP 72 70 71  Wt. (Lbs) 122 124.4 125  BMI 24.27 23.51 23.62      Physical Exam  Constitutional: She is oriented to person, place, and time.  Well developed, well nourished, NAD, polite  HENT:  Head: Normocephalic and atraumatic.  Eyes: No scleral icterus.  Neck: Normal range of motion. Neck supple. No thyromegaly present.  Cardiovascular: Normal rate, regular rhythm and normal heart sounds.   Pulmonary/Chest: Effort normal and breath sounds normal.  Abdominal: Soft. Bowel sounds are normal. There is no tenderness.  Genitourinary:  Genitourinary Comments: No PAP at this point as pt is a virgin with intact hymen.  Musculoskeletal: She exhibits no edema.  LEs, UEs, and back with full aROM, no pain elicited.  Lymphadenopathy:    She has no cervical adenopathy.  Neurological: She is alert and oriented to person, place, and time. She has normal reflexes. No cranial nerve deficit. Coordination normal.  Skin: Skin is warm and dry. No rash noted. No erythema. No pallor.  Multiple hyperpigmented postinflammatory lesions on face and abdomen. Some erythematous papulopustular lesions on face.  Psychiatric: She has a normal mood and affect. Her behavior is normal. Thought content normal.  Vitals reviewed.    Assessment & Plan:   1. Annual physical exam - CBC with Differential - Comprehensive metabolic panel - Lipid Panel  2.  Heartburn - omeprazole (PRILOSEC) 40 MG capsule; Take 1 capsule (40 mg total) by mouth daily.  Dispense: 30 capsule; Refill: 3  3. Chronic pain of both knees - naproxen (NAPROSYN) 500 MG tablet; Take 1 tablet (500 mg total) by mouth 2 (two) times daily with a meal.  Dispense: 30 tablet; Refill: 0  4. Acne, unspecified acne type - tretinoin (RETIN-A) 0.1 % cream; Apply topically at  bedtime.  Dispense: 45 g; Refill: 0 - Salicylic Acid 2 % PADS; Apply 1 application topically daily.  Dispense: 30 each; Refill: 5 - Ambulatory referral to Dermatology  5. Need for Tdap vaccination - Tdap vaccine greater than or equal to 7yo IM  6. Need for prophylactic vaccination and inoculation against influenza - Flu Vaccine QUAD 6+ mos PF IM (Fluarix Quad PF)   Meds ordered this encounter  Medications  . tretinoin (RETIN-A) 0.1 % cream    Sig: Apply topically at bedtime.    Dispense:  45 g    Refill:  0    Order Specific Question:   Supervising Provider    Answer:   Quentin Angst [id number]  . Salicylic Acid 2 % PADS    Sig: Apply 1 application topically daily.    Dispense:  30 each    Refill:  5    Order Specific Question:   Supervising Provider    Answer:   Quentin Angst [id number]  . naproxen (NAPROSYN) 500 MG tablet    Sig: Take 1 tablet (500 mg total) by mouth 2 (two) times daily with a meal.    Dispense:  30 tablet    Refill:  0    Order Specific Question:   Supervising Provider    Answer:   Quentin Angst [id number]  . omeprazole (PRILOSEC) 40 MG capsule    Sig: Take 1 capsule (40 mg total) by mouth daily.    Dispense:  30 capsule    Refill:  3    Order Specific Question:   Supervising Provider    Answer:   Quentin Angst [id number]    Follow-up: Return in about 8 weeks (around 09/22/2017) for acne, heartburn.   Loletta Specter PA

## 2017-07-29 LAB — CBC WITH DIFFERENTIAL/PLATELET
Basophils Absolute: 0 10*3/uL (ref 0.0–0.2)
Basos: 0 %
EOS (ABSOLUTE): 0.1 10*3/uL (ref 0.0–0.4)
EOS: 2 %
HEMATOCRIT: 37.3 % (ref 34.0–46.6)
HEMOGLOBIN: 12.2 g/dL (ref 11.1–15.9)
IMMATURE GRANS (ABS): 0 10*3/uL (ref 0.0–0.1)
Immature Granulocytes: 0 %
LYMPHS ABS: 2 10*3/uL (ref 0.7–3.1)
LYMPHS: 35 %
MCH: 25.3 pg — AB (ref 26.6–33.0)
MCHC: 32.7 g/dL (ref 31.5–35.7)
MCV: 77 fL — ABNORMAL LOW (ref 79–97)
MONOCYTES: 6 %
Monocytes Absolute: 0.4 10*3/uL (ref 0.1–0.9)
NEUTROS ABS: 3.4 10*3/uL (ref 1.4–7.0)
Neutrophils: 57 %
Platelets: 290 10*3/uL (ref 150–379)
RBC: 4.82 x10E6/uL (ref 3.77–5.28)
RDW: 14.6 % (ref 12.3–15.4)
WBC: 5.9 10*3/uL (ref 3.4–10.8)

## 2017-07-29 LAB — COMPREHENSIVE METABOLIC PANEL
ALBUMIN: 4.6 g/dL (ref 3.5–5.5)
ALK PHOS: 70 IU/L (ref 39–117)
ALT: 13 IU/L (ref 0–32)
AST: 18 IU/L (ref 0–40)
Albumin/Globulin Ratio: 1.6 (ref 1.2–2.2)
BILIRUBIN TOTAL: 0.2 mg/dL (ref 0.0–1.2)
BUN / CREAT RATIO: 17 (ref 9–23)
BUN: 13 mg/dL (ref 6–20)
CO2: 24 mmol/L (ref 20–29)
CREATININE: 0.75 mg/dL (ref 0.57–1.00)
Calcium: 9.8 mg/dL (ref 8.7–10.2)
Chloride: 99 mmol/L (ref 96–106)
GFR calc non Af Amer: 114 mL/min/{1.73_m2} (ref >59)
GFR, EST AFRICAN AMERICAN: 131 mL/min/{1.73_m2} (ref >59)
GLOBULIN, TOTAL: 2.8 g/dL (ref 1.5–4.5)
GLUCOSE: 73 mg/dL (ref 65–99)
Potassium: 4.2 mmol/L (ref 3.5–5.2)
SODIUM: 139 mmol/L (ref 134–144)
Total Protein: 7.4 g/dL (ref 6.0–8.5)

## 2017-07-29 LAB — LIPID PANEL
CHOLESTEROL TOTAL: 144 mg/dL (ref 100–199)
Chol/HDL Ratio: 2.8 ratio (ref 0.0–4.4)
HDL: 51 mg/dL (ref >39)
LDL CALC: 81 mg/dL (ref 0–99)
Triglycerides: 59 mg/dL (ref 0–149)
VLDL CHOLESTEROL CAL: 12 mg/dL (ref 5–40)

## 2017-08-02 ENCOUNTER — Telehealth (INDEPENDENT_AMBULATORY_CARE_PROVIDER_SITE_OTHER): Payer: Self-pay

## 2017-08-02 NOTE — Telephone Encounter (Signed)
-----   Message from Loletta Specter, PA-C sent at 08/01/2017  6:14 PM EDT ----- Normal/unremarkable results.

## 2017-08-02 NOTE — Telephone Encounter (Signed)
Patient has been made aware of all normal lab results. Chelsey Sampson, CMA

## 2017-09-19 ENCOUNTER — Ambulatory Visit (INDEPENDENT_AMBULATORY_CARE_PROVIDER_SITE_OTHER): Payer: BLUE CROSS/BLUE SHIELD | Admitting: Physician Assistant

## 2017-09-20 ENCOUNTER — Emergency Department (HOSPITAL_COMMUNITY)
Admission: EM | Admit: 2017-09-20 | Discharge: 2017-09-20 | Disposition: A | Discharge status: Home / Self Care | Payer: BLUE CROSS/BLUE SHIELD | Attending: Emergency Medicine | Admitting: Emergency Medicine

## 2017-09-20 ENCOUNTER — Emergency Department (HOSPITAL_COMMUNITY): Payer: BLUE CROSS/BLUE SHIELD

## 2017-09-20 ENCOUNTER — Encounter (HOSPITAL_COMMUNITY): Payer: Self-pay | Admitting: Emergency Medicine

## 2017-09-20 DIAGNOSIS — Z79899 Other long term (current) drug therapy: Secondary | ICD-10-CM | POA: Insufficient documentation

## 2017-09-20 DIAGNOSIS — R112 Nausea with vomiting, unspecified: Secondary | ICD-10-CM | POA: Diagnosis not present

## 2017-09-20 DIAGNOSIS — R1012 Left upper quadrant pain: Secondary | ICD-10-CM | POA: Insufficient documentation

## 2017-09-20 DIAGNOSIS — R197 Diarrhea, unspecified: Secondary | ICD-10-CM | POA: Diagnosis not present

## 2017-09-20 DIAGNOSIS — R1032 Left lower quadrant pain: Secondary | ICD-10-CM | POA: Diagnosis present

## 2017-09-20 DIAGNOSIS — R42 Dizziness and giddiness: Secondary | ICD-10-CM | POA: Diagnosis not present

## 2017-09-20 LAB — COMPREHENSIVE METABOLIC PANEL
ALT: 16 U/L (ref 14–54)
ANION GAP: 9 (ref 5–15)
AST: 21 U/L (ref 15–41)
Albumin: 4.4 g/dL (ref 3.5–5.0)
Alkaline Phosphatase: 60 U/L (ref 38–126)
BILIRUBIN TOTAL: 0.7 mg/dL (ref 0.3–1.2)
BUN: 11 mg/dL (ref 6–20)
CO2: 26 mmol/L (ref 22–32)
Calcium: 9.4 mg/dL (ref 8.9–10.3)
Chloride: 103 mmol/L (ref 101–111)
Creatinine, Ser: 0.64 mg/dL (ref 0.44–1.00)
Glucose, Bld: 85 mg/dL (ref 65–99)
POTASSIUM: 3.5 mmol/L (ref 3.5–5.1)
Sodium: 138 mmol/L (ref 135–145)
TOTAL PROTEIN: 7.7 g/dL (ref 6.5–8.1)

## 2017-09-20 LAB — URINALYSIS, ROUTINE W REFLEX MICROSCOPIC
Bilirubin Urine: NEGATIVE
Glucose, UA: NEGATIVE mg/dL
Hgb urine dipstick: NEGATIVE
KETONES UR: 5 mg/dL — AB
LEUKOCYTES UA: NEGATIVE
NITRITE: NEGATIVE
PH: 5 (ref 5.0–8.0)
Protein, ur: NEGATIVE mg/dL
SPECIFIC GRAVITY, URINE: 1.01 (ref 1.005–1.030)

## 2017-09-20 LAB — CBC
HEMATOCRIT: 37.2 % (ref 36.0–46.0)
HEMOGLOBIN: 12.2 g/dL (ref 12.0–15.0)
MCH: 25.2 pg — ABNORMAL LOW (ref 26.0–34.0)
MCHC: 32.8 g/dL (ref 30.0–36.0)
MCV: 76.9 fL — ABNORMAL LOW (ref 78.0–100.0)
Platelets: 284 10*3/uL (ref 150–400)
RBC: 4.84 MIL/uL (ref 3.87–5.11)
RDW: 13.5 % (ref 11.5–15.5)
WBC: 7.2 10*3/uL (ref 4.0–10.5)

## 2017-09-20 LAB — LIPASE, BLOOD: Lipase: 28 U/L (ref 11–51)

## 2017-09-20 LAB — I-STAT BETA HCG BLOOD, ED (MC, WL, AP ONLY)

## 2017-09-20 MED ORDER — POLYETHYLENE GLYCOL 3350 17 G PO PACK: 17.0000 g | PACK | Freq: Every day | ORAL | 0 refills | Status: AC

## 2017-09-20 MED ORDER — ACETAMINOPHEN 325 MG PO TABS
650.0000 mg | ORAL_TABLET | Freq: Once | ORAL | Status: AC
  Administered 2017-09-20: 650 mg via ORAL
  Filled 2017-09-20: qty 2

## 2017-09-20 MED ORDER — IOPAMIDOL (ISOVUE-300) INJECTION 61%
100.0000 mL | Freq: Once | INTRAVENOUS | Status: AC | PRN
  Administered 2017-09-20: 100 mL via INTRAVENOUS

## 2017-09-20 MED ORDER — IOPAMIDOL (ISOVUE-300) INJECTION 61%
INTRAVENOUS | Status: AC
  Filled 2017-09-20: qty 100

## 2017-09-20 NOTE — ED Provider Notes (Signed)
[facility] Note   CSN: [id number] Arrival date & time: 09/20/17  1652     History   Chief Complaint Chief Complaint  Patient presents with  . Abdominal Pain    HPI Chelsey Sampson is a 21 y.o. female.  HPI   21 year old female presents today with complaints of severe left lower quadrant abdominal pain.  Patient reports approximately 2 weeks of worsening pain.  She notes the pain coming in waves sharp in nature.  She notes the pain was so severe earlier today that she got dizzy and lightheaded and associated nausea and episode of vomiting.  She notes over the last 2 days she has had hard bowel movements, notes today normal bowel movement.  She denies any changes, denies any fever.  Patient reports LMP September 25, no vaginal discharge or bleeding.    Past Medical History:  Diagnosis Date  . Syncope     There are no active problems to display for this patient.   History reviewed. No pertinent surgical history.  OB History    No data available      Home Medications    Prior to Admission medications   Medication Sig Start Date End Date Taking? Authorizing Provider  minocycline (MINOCIN,DYNACIN) 50 MG capsule TK ONE C PO BID 08/30/17  Yes [provider]  chlorhexidine (HIBICLENS) 4 % external liquid Apply topically daily as needed. Patient not taking: Reported on 09/20/2017 06/16/17   Loletta SpecterGomez, Roger David, PA-C  hydrOXYzine (ATARAX/VISTARIL) 25 MG tablet Take 1 tablet (25 mg total) by mouth at bedtime. Patient not taking: Reported on 09/20/2017 06/16/17   Loletta SpecterGomez, Roger David, PA-C  naproxen (NAPROSYN) 500 MG tablet Take 1 tablet (500 mg total) by mouth 2 (two) times daily with a meal. Patient not taking: Reported on 09/20/2017 07/28/17   Loletta SpecterGomez, Roger David, PA-C  omeprazole (PRILOSEC) 40 MG capsule Take 1 capsule (40 mg total) by mouth daily. Patient not taking: Reported on 09/20/2017 07/28/17   Loletta SpecterGomez, Roger David, PA-C    polyethylene glycol [facility](MIRALAX) packet Take 17 g daily by mouth. 09/20/17   Anetra Czerwinski, Tinnie GensJeffrey, PA-C  predniSONE (DELTASONE) 20 MG tablet Take 2 tablets (40 mg total) by mouth daily with breakfast. Patient not taking: Reported on 07/28/2017 05/19/17   Loletta SpecterGomez, Roger David, PA-C  Salicylic Acid 2 % PADS Apply 1 application topically daily. Patient not taking: Reported on 09/20/2017 07/28/17   Loletta SpecterGomez, Roger David, PA-C  tretinoin (RETIN-A) 0.1 % cream Apply topically at bedtime. Patient not taking: Reported on 09/20/2017 07/28/17   Loletta SpecterGomez, Roger David, PA-C   Family History No family history on file.  Social History Social History   Tobacco Use  . Smoking status: Never Smoker  . Smokeless tobacco: Never Used  Substance Use Topics  . Alcohol use: No  . Drug use: No     Allergies   Patient has no known allergies.   Review of Systems Review of Systems  All other systems reviewed and are negative.    Physical Exam Updated Vital Signs BP 138/77 (BP Location: Right Arm)   Pulse 87   Temp 97.7 F (36.5 C) (Oral)   Resp 14   Ht 5' (1.524 m)   Wt 55.8 kg (123 lb)   LMP 08/31/2017 Comment: NEG U PREG 09/20/17  SpO2 100%   BMI 24.02 kg/m   Physical Exam  Constitutional: She is oriented to person, place, and time. She appears well-developed and well-nourished.  HENT:  Head: Normocephalic and  atraumatic.  Eyes: Conjunctivae are normal. Pupils are equal, round, and reactive to light. Right eye exhibits no discharge. Left eye exhibits no discharge. No scleral icterus.  Neck: Normal range of motion. No JVD present. No tracheal deviation present.  Pulmonary/Chest: Effort normal. No stridor.  Abdominal: Bowel sounds are normal. There is no splenomegaly or hepatomegaly. There is no rigidity, no rebound, no guarding, no CVA tenderness, no tenderness at McBurney's point and negative Murphy's sign. No hernia.  Right lower and right upper abd TTP- no right sided abd pain  Neurological: She is  alert and oriented to person, place, and time. Coordination normal.  Psychiatric: She has a normal mood and affect. Her behavior is normal. Judgment and thought content normal.  Nursing note and vitals reviewed.    ED Treatments / Results  Labs (all labs ordered are listed, but only abnormal results are displayed) Labs Reviewed  CBC - Abnormal; Notable for the following components:      Result Value   MCV 76.9 (*)    MCH 25.2 (*)    All other components within normal limits  URINALYSIS, ROUTINE W REFLEX MICROSCOPIC - Abnormal; Notable for the following components:   Ketones, ur 5 (*)    All other components within normal limits  LIPASE, BLOOD  COMPREHENSIVE METABOLIC PANEL  I-STAT BETA HCG BLOOD, ED ([facility], [facility], [facility] ONLY)    EKG  EKG Interpretation None       Radiology Ct Abdomen Pelvis W Contrast  Result Date: 09/20/2017 CLINICAL DATA:  Unspecified abdominal pain, left lower quadrant EXAM: CT ABDOMEN AND PELVIS WITH CONTRAST TECHNIQUE: Multidetector CT imaging of the abdomen and pelvis was performed using the standard protocol following bolus administration of intravenous contrast. CONTRAST:  100mL ISOVUE-300 IOPAMIDOL (ISOVUE-300) INJECTION 61% COMPARISON:  None. FINDINGS: Lower chest: No acute abnormality. Hepatobiliary: No focal liver abnormality is seen. No gallstones, gallbladder wall thickening, or biliary dilatation. Pancreas: Unremarkable. No pancreatic ductal dilatation or surrounding inflammatory changes. Spleen: Normal in size without focal abnormality. Adrenals/Urinary Tract: Adrenal glands are unremarkable. Kidneys are normal, without renal calculi, focal lesion, or hydronephrosis. Bladder is unremarkable. Stomach/Bowel: Stomach is within normal limits. Appendix appears normal. No evidence of bowel wall thickening, distention, or inflammatory changes. Vascular/Lymphatic: No significant vascular findings are present. No enlarged abdominal or pelvic lymph nodes. Reproductive:  Uterus and bilateral adnexa are unremarkable. Other: Small free fluid in the pelvis.  Negative for free air Musculoskeletal: No acute or significant osseous findings. IMPRESSION: 1. No CT evidence for acute intra-abdominal or pelvic abnormality. 2. Small amount of free fluid in the pelvis. Electronically Signed   By: Jasmine PangKim  Fujinaga M.D.   On: 09/20/2017 22:53    Procedures Procedures (including critical care time)  Medications Ordered in ED Medications  iopamidol (ISOVUE-300) 61 % injection (not administered)  acetaminophen (TYLENOL) tablet 650 mg (not administered)  iopamidol (ISOVUE-300) 61 % injection 100 mL (100 mLs Intravenous Contrast Given 09/20/17 2236)     Initial Impression / Assessment and Plan / ED Course  I have reviewed the triage vital signs and the nursing notes.  Pertinent labs & imaging results that were available during my care of the patient were reviewed by me and considered in my medical decision making (see chart for details).     Final Clinical Impressions(s) / ED Diagnoses   Final diagnoses:  Left lower quadrant pain    Labs: UA, I stat beta HCG, lipase, CMP  Imaging: CT abdomen pelvis with contrast  Consults:  Therapeutics:  Tylenol  Discharge Meds: Miralax  Assessment/Plan: 21 year old female presents today with complaints of abdominal pain.  Patient reporting left lower and upper quadrant abdominal pain.  I discussed with the patient that this potentially could be constipation, gas pain given her reassuring laboratory analysis.  Patient reports this pain is very severe causing significant discomfort.  CT scan showed no significant findings.  Low suspicion for pelvic pathology including ovarian torsion.  Patient has had intermittent diarrhea, she will be given MiraLAX encouraged follow-up with her primary care provider.  I have very low suspicion for any acute life-threatening intra-abdominal pathology, she is given strict return precautions, she  verbalized understanding and agreement to today's plan had      ED Discharge Orders        Ordered    polyethylene glycol [facility]) packet  Daily     09/20/17 2302       Eyvonne Mechanic, PA-C 09/20/17 2305    Pricilla Loveless, MD 09/21/17 0003

## 2017-09-20 NOTE — ED Notes (Signed)
Patient reports last BM was last night and was hard.

## 2017-09-20 NOTE — ED Triage Notes (Signed)
Patient reports that started having LLQ today that was severe until 2pm ans since then has been decreased in pain. Patient reports that she vomited once. Denies any problems with urination.

## 2017-09-20 NOTE — ED Notes (Signed)
Patient transported to CT 

## 2017-09-20 NOTE — ED Notes (Signed)
Patient return from CT

## 2017-09-20 NOTE — Discharge Instructions (Signed)
Please read attached information. If you experience any new or worsening signs or symptoms please return to the emergency room for evaluation. Please follow-up with your primary care provider or specialist as discussed. Please use medication prescribed only as directed and discontinue taking if you have any concerning signs or symptoms.   °

## 2018-07-06 IMAGING — CT CT ABD-PELV W/ CM
2 of 4 series · 17 of 46 positions shown, 19 images · IV contrast (ISOVUE)
Comparison: None.

CLINICAL DATA: Unspecified abdominal pain, left lower quadrant

EXAM:
CT ABDOMEN AND PELVIS WITH CONTRAST
TECHNIQUE: Multidetector CT imaging of the abdomen and pelvis was performed
using the standard protocol following bolus administration of
intravenous contrast.
CONTRAST:  100mL Y3IYY3-1CC IOPAMIDOL (Y3IYY3-1CC) INJECTION 61%

[Series 2: abd/pel with · axial · 0.60mm/px · z∈[-383,-38]mm · 14 of 79 slices shown, 16 images]
[im 5/79  soft-tissue]
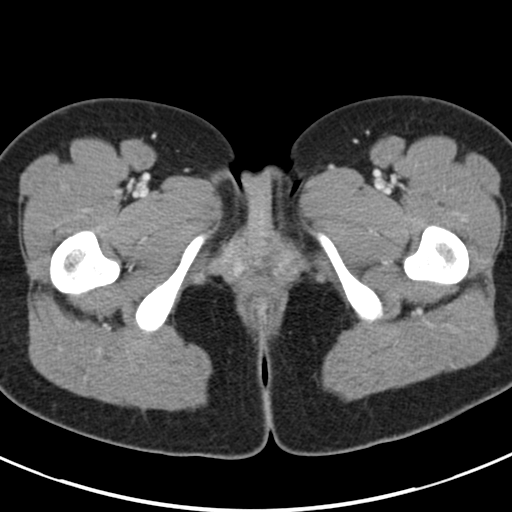
[im 5/79  bone]
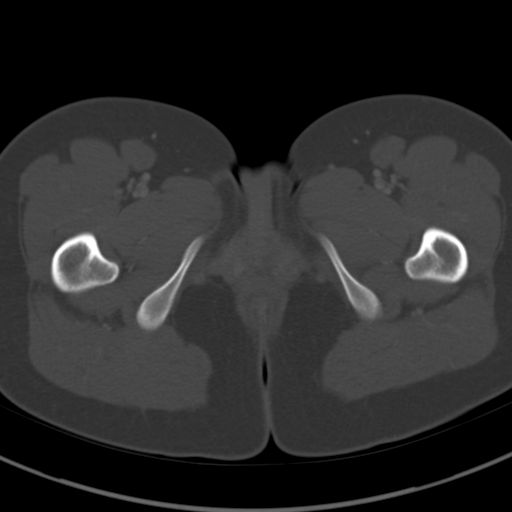
[im 9/79  soft-tissue]
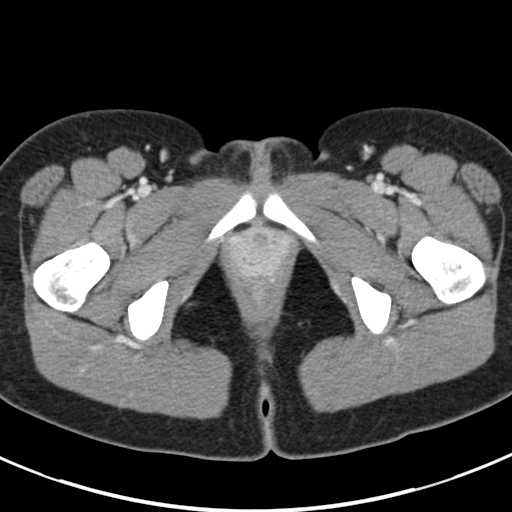
[im 17/79  soft-tissue]
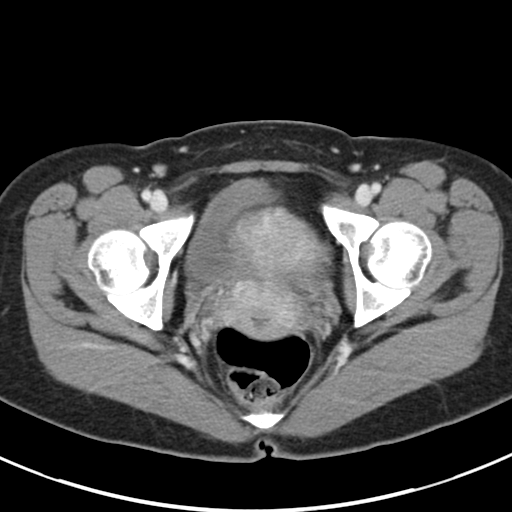
[im 21/79  soft-tissue]
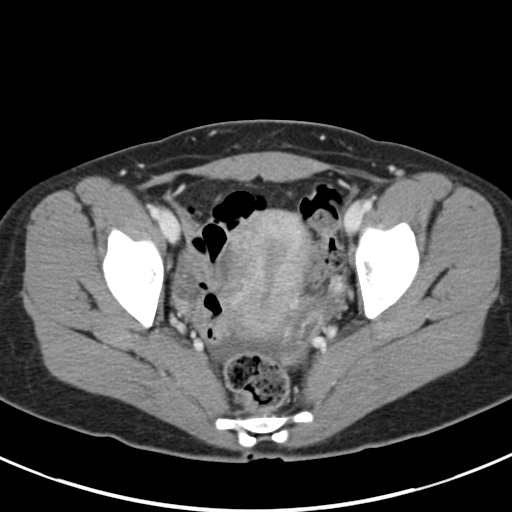
[im 25/79  soft-tissue]
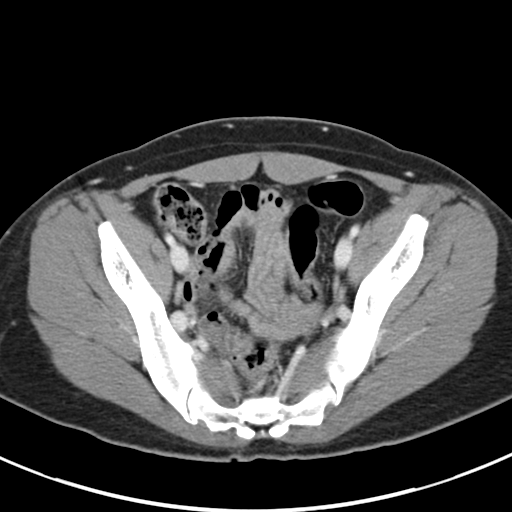
[im 33/79  soft-tissue]
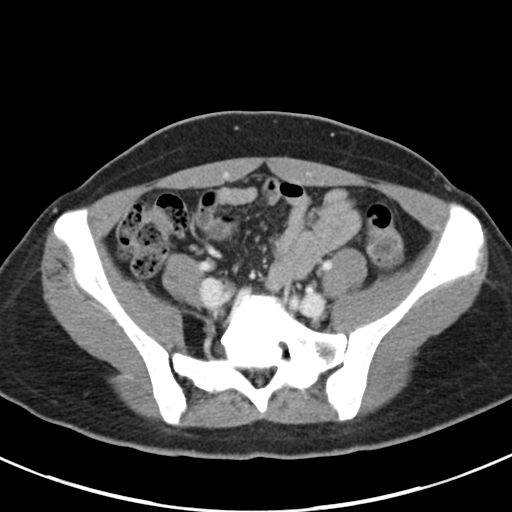
[im 37/79  soft-tissue]
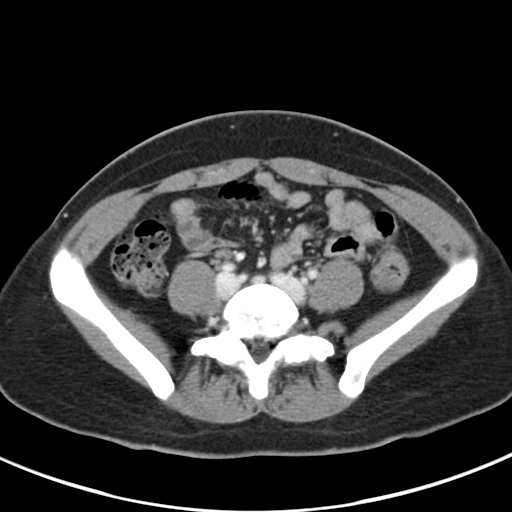
[im 42/79  soft-tissue]
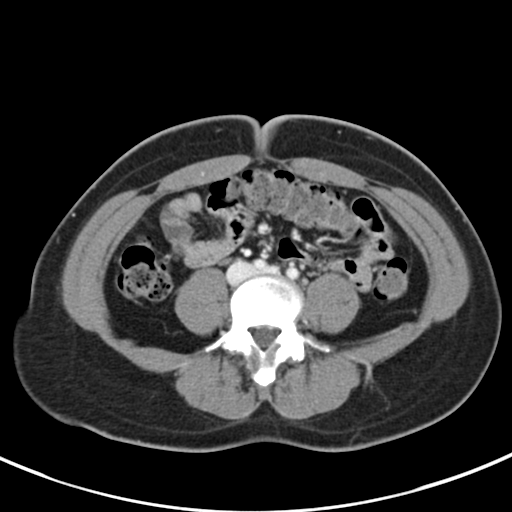
[im 46/79  soft-tissue]
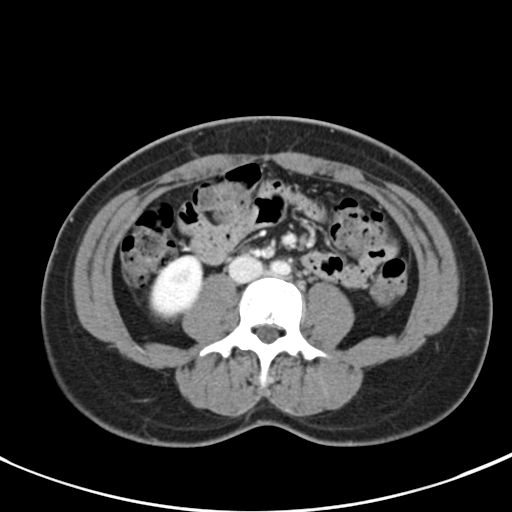
[im 46/79  bone]
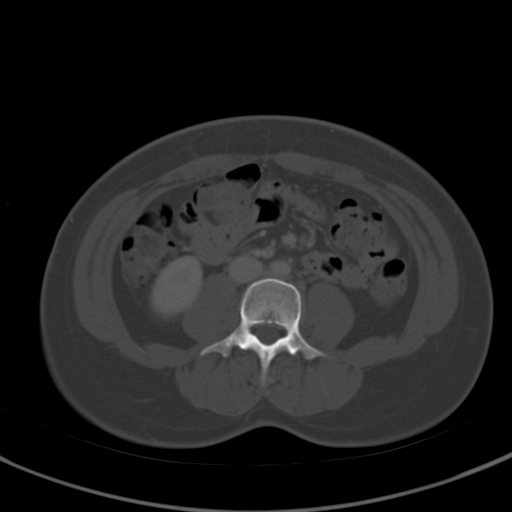
[im 54/79  soft-tissue]
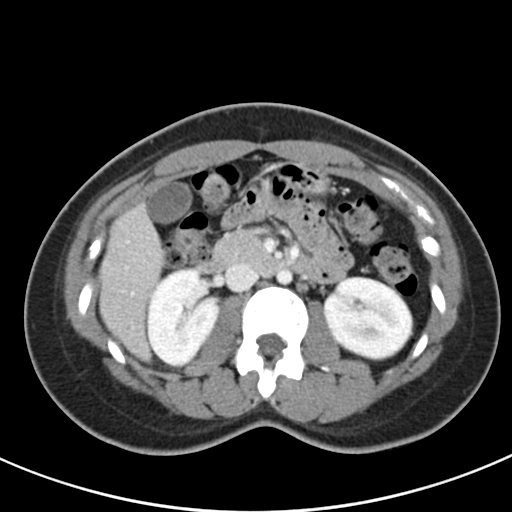
[im 58/79  soft-tissue]
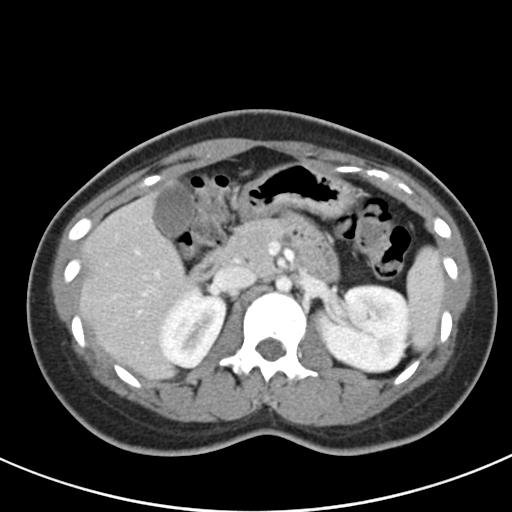
[im 62/79  soft-tissue]
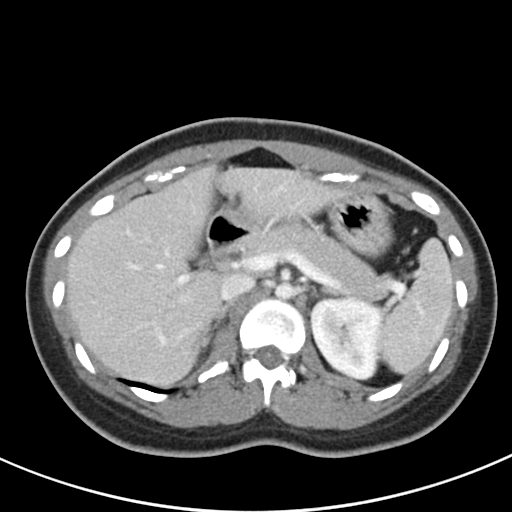
[im 70/79  soft-tissue]
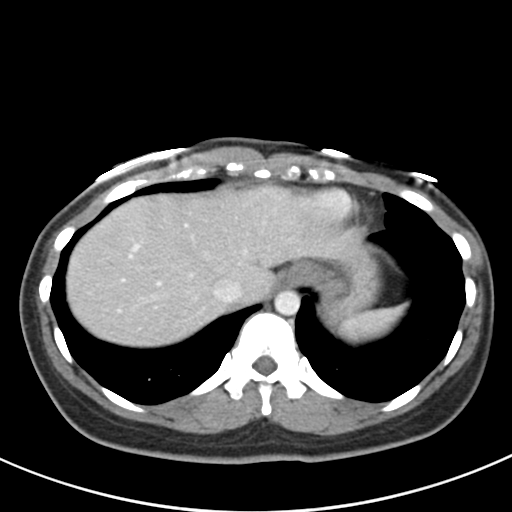
[im 74/79  soft-tissue]
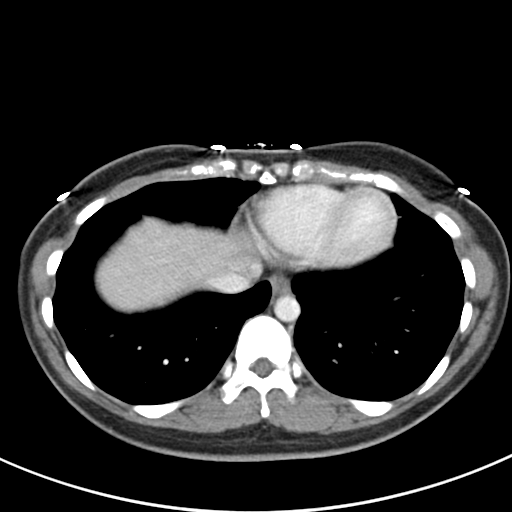

[Series 5: coronal a/|p · coronal · 0.73mm/px · 3 of 93 slices shown]
[im 31/93  soft-tissue]
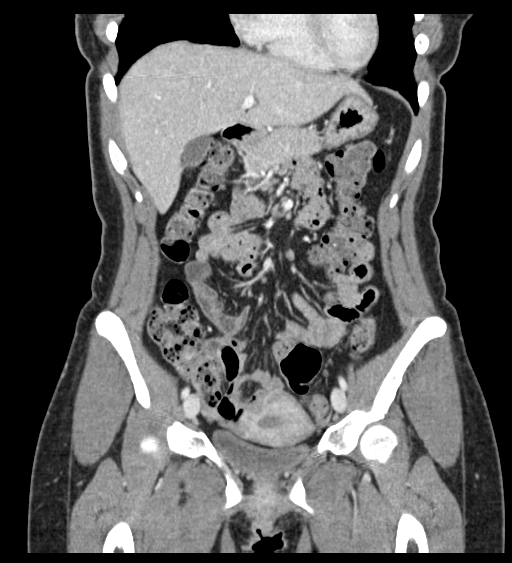
[im 41/93  soft-tissue]
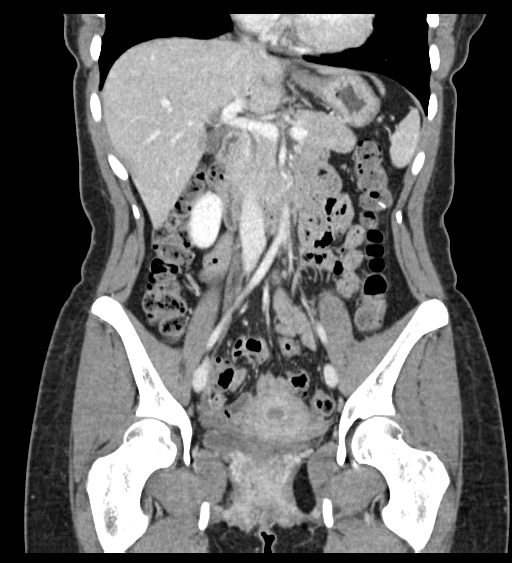
[im 52/93  soft-tissue]
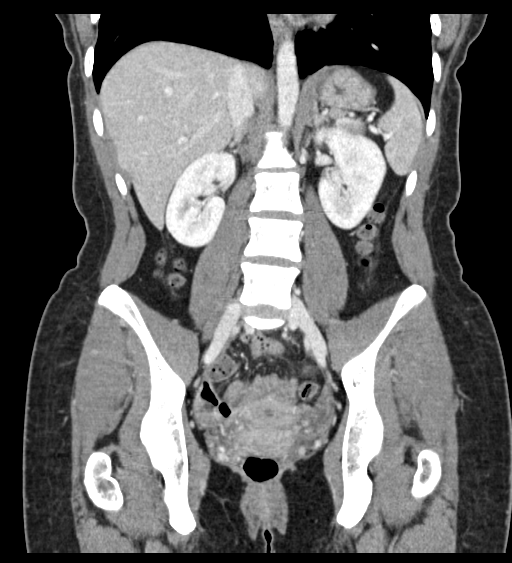

[17 of 46 positions shown; findings below may reference images not displayed]

FINDINGS: Lower chest: No acute abnormality.

Hepatobiliary: No focal liver abnormality is seen. No gallstones,
gallbladder wall thickening, or biliary dilatation.

Pancreas: Unremarkable. No pancreatic ductal dilatation or
surrounding inflammatory changes.

Spleen: Normal in size without focal abnormality.

Adrenals/Urinary Tract: Adrenal glands are unremarkable. Kidneys are
normal, without renal calculi, focal lesion, or hydronephrosis.
Bladder is unremarkable.

Stomach/Bowel: Stomach is within normal limits. Appendix appears
normal. No evidence of bowel wall thickening, distention, or
inflammatory changes.

Vascular/Lymphatic: No significant vascular findings are present. No
enlarged abdominal or pelvic lymph nodes.

Reproductive: Uterus and bilateral adnexa are unremarkable.

Other: Small free fluid in the pelvis.  Negative for free air

Musculoskeletal: No acute or significant osseous findings.
IMPRESSION: 1. No CT evidence for acute intra-abdominal or pelvic abnormality.
2. Small amount of free fluid in the pelvis.

## 2020-02-14 ENCOUNTER — Ambulatory Visit: Payer: BLUE CROSS/BLUE SHIELD

## 2020-03-21 ENCOUNTER — Ambulatory Visit: Payer: BLUE CROSS/BLUE SHIELD | Admitting: Internal Medicine

## 2020-08-28 ENCOUNTER — Other Ambulatory Visit: Payer: Self-pay

## 2020-08-28 ENCOUNTER — Encounter (HOSPITAL_COMMUNITY): Payer: Self-pay

## 2020-08-28 ENCOUNTER — Emergency Department (HOSPITAL_COMMUNITY): Payer: Self-pay

## 2020-08-28 DIAGNOSIS — R0602 Shortness of breath: Secondary | ICD-10-CM | POA: Insufficient documentation

## 2020-08-28 DIAGNOSIS — Z91030 Bee allergy status: Secondary | ICD-10-CM | POA: Insufficient documentation

## 2020-08-28 NOTE — ED Triage Notes (Signed)
Patient stating she was stung by a bee two weekends ago and given medications. States she took prednisone today and started feeling short of breath.

## 2020-08-29 ENCOUNTER — Emergency Department (HOSPITAL_COMMUNITY)
Admission: EM | Admit: 2020-08-29 | Discharge: 2020-08-29 | Disposition: A | Discharge status: Home / Self Care | Payer: Self-pay | Attending: Emergency Medicine | Admitting: Emergency Medicine

## 2020-08-29 DIAGNOSIS — R0602 Shortness of breath: Secondary | ICD-10-CM

## 2020-08-29 NOTE — Discharge Instructions (Addendum)
Your evaluation in the ED today has been reassuring. We recommend follow-up with an allergy specialist for allergy testing. You may return to the ED for new or concerning symptoms.

## 2020-08-29 NOTE — ED Provider Notes (Signed)
[facility]-EMERGENCY DEPT Provider Note   CSN: [id number] Arrival date & time: 08/28/20  2100     History Chief Complaint  Patient presents with  . Shortness of Breath    Chelsey Sampson is a 24 y.o. female.  24 year old female presents to the emergency department for evaluation of shortness of breath.  She reports that she was stung by a bee 3 weeks ago.  She began to have facial swelling and difficulty breathing 1 week later.  Was treated at [facility]SH for suspected anaphylaxis, though felt unrelated to bee sting.  Was placed on Benadryl and a course of prednisone.  She resumed feeling short of breath after taking prednisone today.  Had no rash, facial or tongue swelling, difficulty swallowing, fevers, vomiting.  Her symptoms have spontaneously improved.  The history is provided by the patient. No language interpreter was used.       Past Medical History:  Diagnosis Date  . Syncope     There are no problems to display for this patient.   History reviewed. No pertinent surgical history.   OB History   No obstetric history on file.     No family history on file.  Social History   Tobacco Use  . Smoking status: Never Smoker  . Smokeless tobacco: Never Used  Vaping Use  . Vaping Use: Never used  Substance Use Topics  . Alcohol use: No  . Drug use: No    Home Medications Prior to Admission medications   Medication Sig Start Date End Date Taking? Authorizing Provider  chlorhexidine (HIBICLENS) 4 % external liquid Apply topically daily as needed. Patient not taking: Reported on 09/20/2017 06/16/17   Loletta Specter, PA-C  hydrOXYzine (ATARAX/VISTARIL) 25 MG tablet Take 1 tablet (25 mg total) by mouth at bedtime. Patient not taking: Reported on 09/20/2017 06/16/17   Loletta Specter, PA-C  minocycline (MINOCIN,DYNACIN) 50 MG capsule TK ONE C PO BID 08/30/17   [provider]  naproxen (NAPROSYN) 500 MG tablet Take 1 tablet (500 mg total) by  mouth 2 (two) times daily with a meal. Patient not taking: Reported on 09/20/2017 07/28/17   Loletta Specter, PA-C  omeprazole (PRILOSEC) 40 MG capsule Take 1 capsule (40 mg total) by mouth daily. Patient not taking: Reported on 09/20/2017 07/28/17   Loletta Specter, PA-C  polyethylene glycol [facility]) packet Take 17 g daily by mouth. 09/20/17   Hedges, Tinnie Gens, PA-C  predniSONE (DELTASONE) 20 MG tablet Take 2 tablets (40 mg total) by mouth daily with breakfast. Patient not taking: Reported on 07/28/2017 05/19/17   Loletta Specter, PA-C  Salicylic Acid 2 % PADS Apply 1 application topically daily. Patient not taking: Reported on 09/20/2017 07/28/17   Loletta Specter, PA-C  tretinoin (RETIN-A) 0.1 % cream Apply topically at bedtime. Patient not taking: Reported on 09/20/2017 07/28/17   Loletta Specter, PA-C    Allergies    Bee venom  Review of Systems   Review of Systems  Ten systems reviewed and are negative for acute change, except as noted in the HPI.    Physical Exam Updated Vital Signs BP 116/68   Pulse (!) 49   Temp 98 F (36.7 C) (Oral)   Resp 12   SpO2 100%   Physical Exam Vitals and nursing note reviewed.  Constitutional:      General: She is not in acute distress.    Appearance: She is well-developed. She is not diaphoretic.     Comments:  Nontoxic appearing and in NAD  HENT:     Head: Normocephalic and atraumatic.     Mouth/Throat:     Comments: No angioedema. Tolerating secretions. Normal phonation. Eyes:     General: No scleral icterus.    Conjunctiva/sclera: Conjunctivae normal.  Pulmonary:     Effort: Pulmonary effort is normal. No respiratory distress.     Comments: Respirations even and unlabored. Musculoskeletal:        General: Normal range of motion.     Cervical back: Normal range of motion.  Skin:    General: Skin is warm and dry.     Coloration: Skin is not pale.     Findings: No erythema or rash.  Neurological:     Mental Status:  She is alert and oriented to person, place, and time.  Psychiatric:        Behavior: Behavior normal.     ED Results / Procedures / Treatments   Labs (all labs ordered are listed, but only abnormal results are displayed) Labs Reviewed - No data to display   EKG ED ECG REPORT   Date: 08/29/2020  Rate: 50  Rhythm: normal sinus rhythm  QRS Axis: normal  Intervals: normal  ST/T Wave abnormalities: nonspecific ST changes  Conduction Disutrbances:IVCD, consider atypical RBBB  Narrative Interpretation: NSR  Old EKG Reviewed: none available  I have personally reviewed the EKG tracing and agree with the computerized printout as noted.   Radiology DG Chest 2 View  Result Date: 08/28/2020 CLINICAL DATA:  Shortness of breath, history of Bee sting 2 weeks ago EXAM: CHEST - 2 VIEW COMPARISON:  None. FINDINGS: The heart size and mediastinal contours are within normal limits. Both lungs are clear. The visualized skeletal structures are unremarkable. IMPRESSION: No active cardiopulmonary disease. Electronically Signed   By: Alcide Clever M.D.   On: 08/28/2020 21:25    Procedures Procedures (including critical care time)  Medications Ordered in ED Medications - No data to display  ED Course  I have reviewed the triage vital signs and the nursing notes.  Pertinent labs & imaging results that were available during my care of the patient were reviewed by me and considered in my medical decision making (see chart for details).    MDM Rules/Calculators/A&P                          24 year old presenting for shortness of breath which has spontaneously resolved.  This developed after taking prednisone, though she has tolerated this medication previously without issue.  Seems adamant that her recurrent shortness of breath is related to a bee sting from 3 weeks ago.  I have explained to the patient that her symptoms today are unlikely to be related to this.  She does appear to have been treated  for an anaphylactic reaction 2 weeks ago.  May benefit from formal allergy testing given persistent symptoms.  Have advised that she continue previously prescribed medications.  Return precautions discussed and provided. Patient discharged in stable condition with no unaddressed concerns.   Final Clinical Impression(s) / ED Diagnoses Final diagnoses:  SOB (shortness of breath)    Rx / DC Orders ED Discharge Orders    None       Antony Madura, PA-C 09/18/20 0523    Palumbo, April, MD 09/18/20 [phone]

## 2021-06-13 IMAGING — CR DG CHEST 2V
2 series · 2 of 2 positions shown · non-contrast
Comparison: None.

CLINICAL DATA: Shortness of breath, history of Bee sting 2 weeks
ago

EXAM:
CHEST - 2 VIEW

[w chest pa]
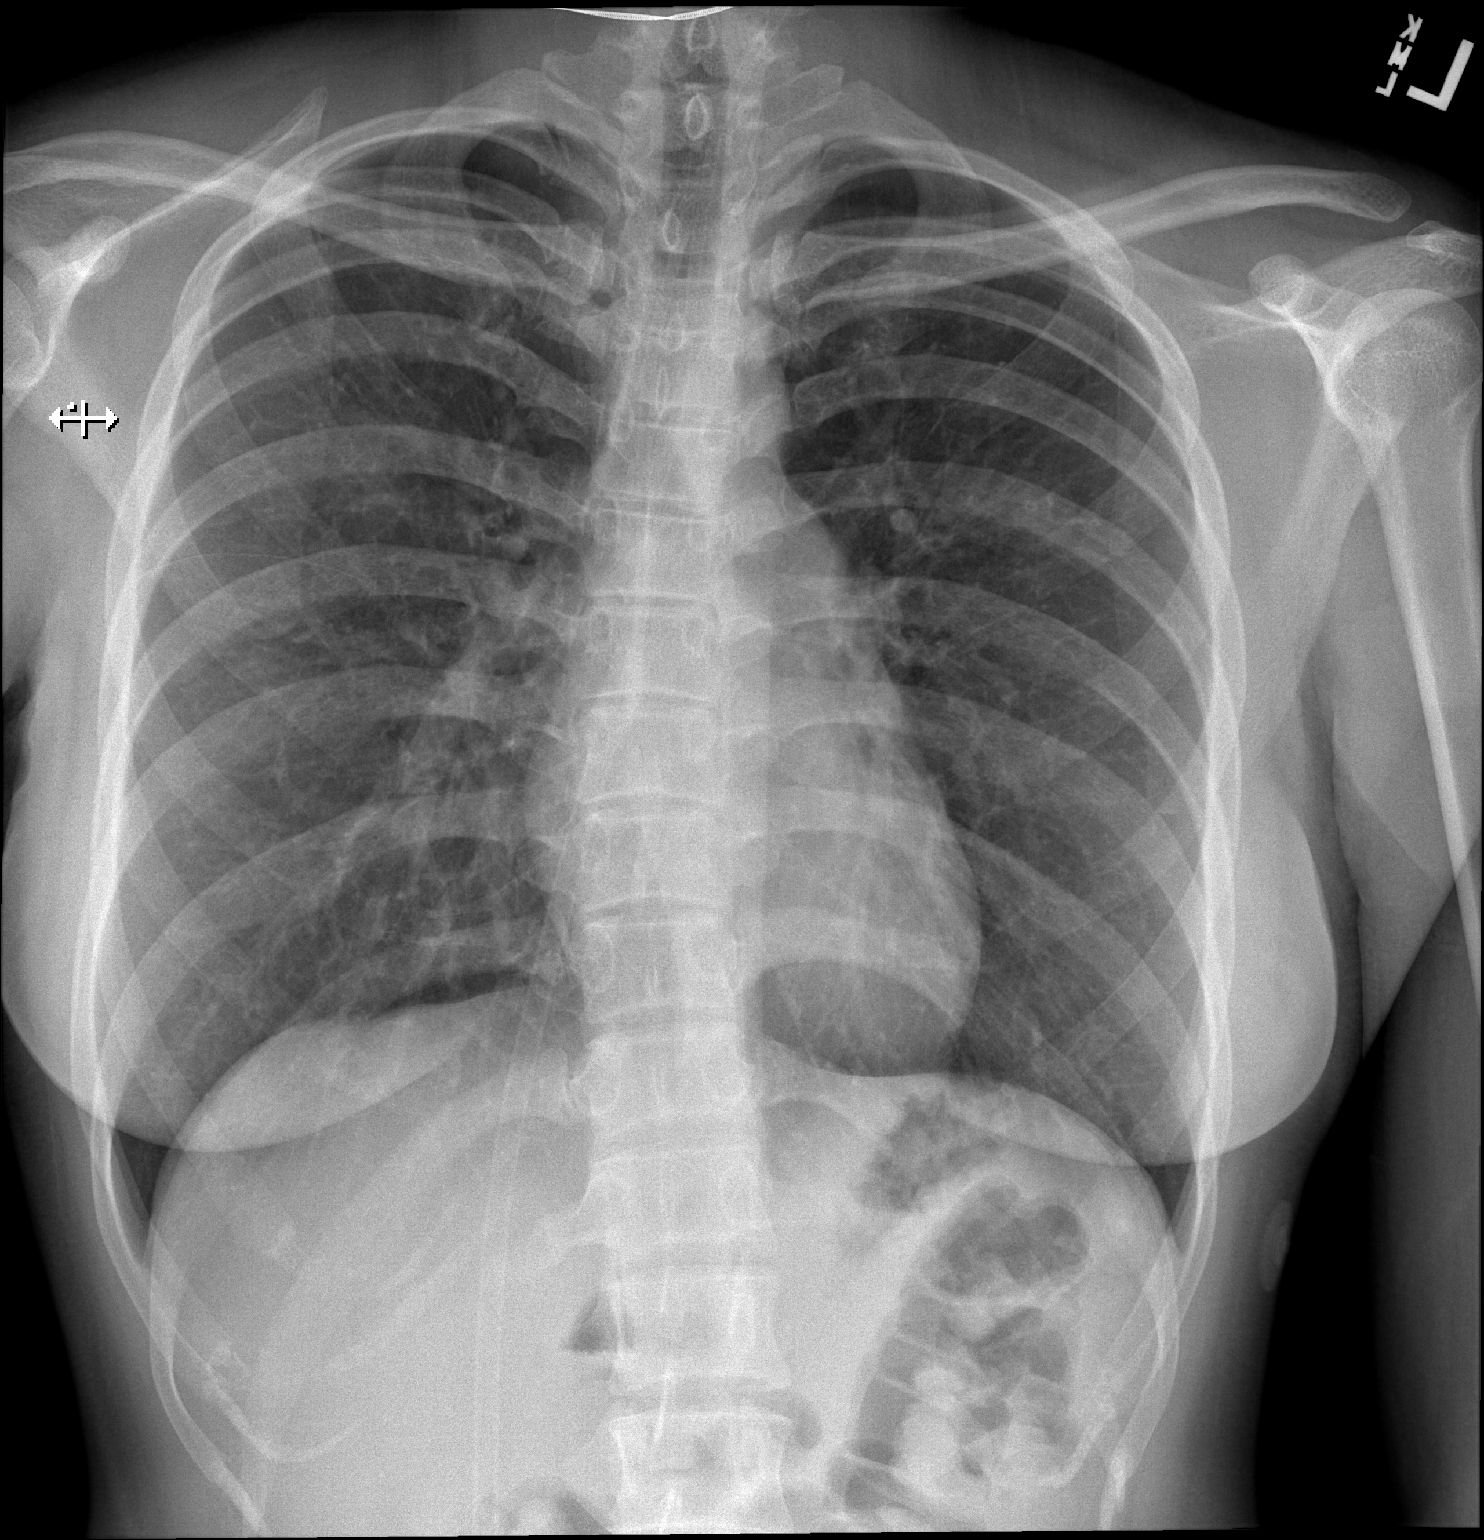

[w chest lat]
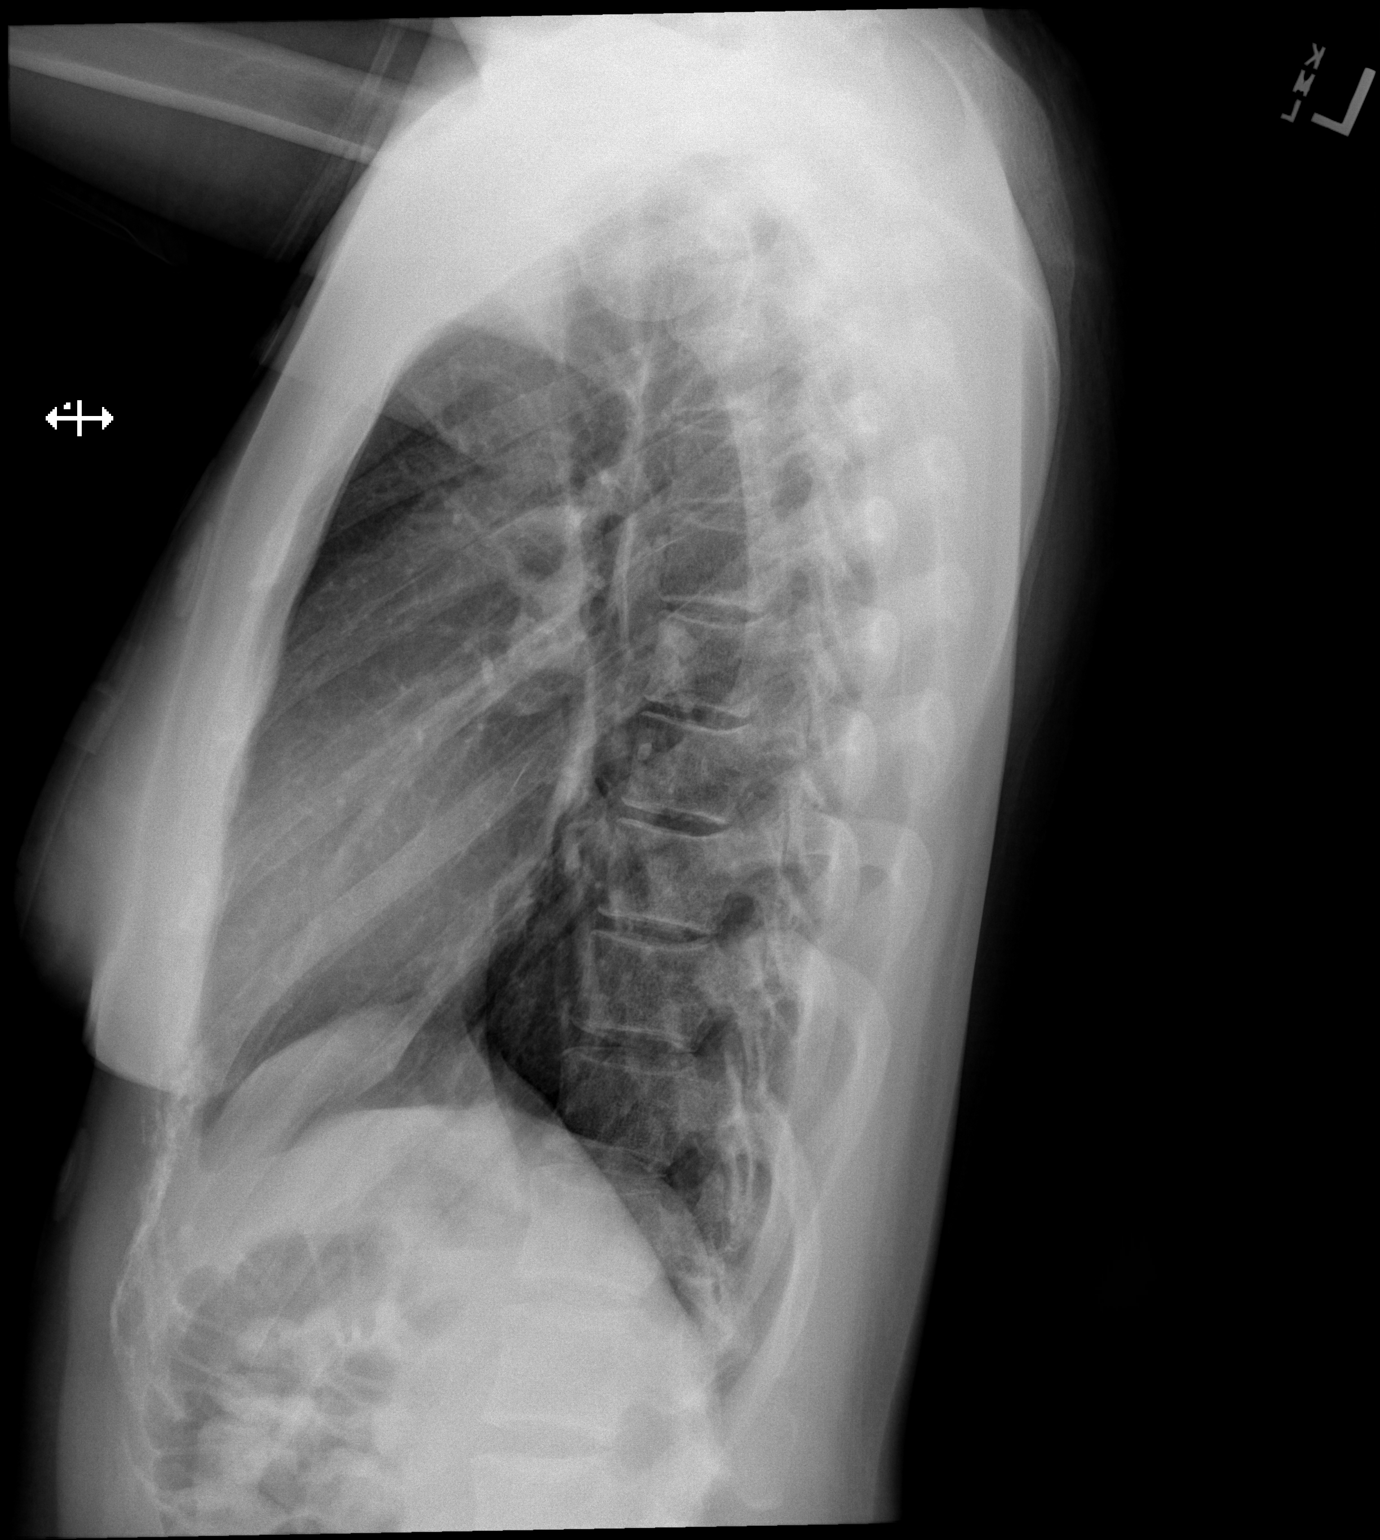

[2 of 2 positions shown; findings below may reference images not displayed]

FINDINGS: The heart size and mediastinal contours are within normal limits.
Both lungs are clear. The visualized skeletal structures are
unremarkable.
IMPRESSION: No active cardiopulmonary disease.
